# Patient Record
Sex: Male | Born: 1960 | Race: White | Hispanic: No | Smoking: Former smoker
Health system: Southern US, Community
[De-identification: ages and names within clinical notes are randomized; demographics above are authoritative.]

## PROBLEM LIST (undated history)

## (undated) DIAGNOSIS — F419 Anxiety disorder, unspecified: Secondary | ICD-10-CM

## (undated) DIAGNOSIS — K589 Irritable bowel syndrome without diarrhea: Secondary | ICD-10-CM

## (undated) DIAGNOSIS — F329 Major depressive disorder, single episode, unspecified: Secondary | ICD-10-CM

## (undated) DIAGNOSIS — G4730 Sleep apnea, unspecified: Secondary | ICD-10-CM

## (undated) DIAGNOSIS — D649 Anemia, unspecified: Secondary | ICD-10-CM

## (undated) DIAGNOSIS — M1990 Unspecified osteoarthritis, unspecified site: Secondary | ICD-10-CM

## (undated) DIAGNOSIS — J449 Chronic obstructive pulmonary disease, unspecified: Secondary | ICD-10-CM

## (undated) DIAGNOSIS — K219 Gastro-esophageal reflux disease without esophagitis: Secondary | ICD-10-CM

## (undated) DIAGNOSIS — T7840XA Allergy, unspecified, initial encounter: Secondary | ICD-10-CM

## (undated) DIAGNOSIS — G709 Myoneural disorder, unspecified: Secondary | ICD-10-CM

## (undated) DIAGNOSIS — I10 Essential (primary) hypertension: Secondary | ICD-10-CM

## (undated) DIAGNOSIS — F32A Depression, unspecified: Secondary | ICD-10-CM

## (undated) HISTORY — PX: COLONOSCOPY: SHX174

## (undated) HISTORY — DX: Myoneural disorder, unspecified: G70.9

## (undated) HISTORY — DX: Anemia, unspecified: D64.9

## (undated) HISTORY — DX: Allergy, unspecified, initial encounter: T78.40XA

## (undated) HISTORY — DX: Chronic obstructive pulmonary disease, unspecified: J44.9

## (undated) HISTORY — DX: Unspecified osteoarthritis, unspecified site: M19.90

---

## 1982-03-09 HISTORY — PX: HERNIA REPAIR: SHX51

## 1988-03-09 HISTORY — PX: BACK SURGERY: SHX140

## 1990-03-09 HISTORY — PX: KNEE SURGERY: SHX244

## 2013-05-10 ENCOUNTER — Ambulatory Visit: Payer: Self-pay | Admitting: Family Medicine

## 2014-10-22 ENCOUNTER — Encounter: Payer: Self-pay | Admitting: Family Medicine

## 2014-10-22 ENCOUNTER — Ambulatory Visit (INDEPENDENT_AMBULATORY_CARE_PROVIDER_SITE_OTHER): Payer: No Typology Code available for payment source | Admitting: Family Medicine

## 2014-10-22 VITALS — BP 140/80 | HR 80 | Temp 98.2°F | Resp 16 | Ht 74.0 in | Wt 291.8 lb

## 2014-10-22 DIAGNOSIS — R0789 Other chest pain: Secondary | ICD-10-CM

## 2014-10-22 DIAGNOSIS — S29011A Strain of muscle and tendon of front wall of thorax, initial encounter: Secondary | ICD-10-CM | POA: Diagnosis not present

## 2014-10-22 DIAGNOSIS — R7303 Prediabetes: Secondary | ICD-10-CM | POA: Insufficient documentation

## 2014-10-22 DIAGNOSIS — R0683 Snoring: Secondary | ICD-10-CM | POA: Insufficient documentation

## 2014-10-22 DIAGNOSIS — R351 Nocturia: Secondary | ICD-10-CM | POA: Insufficient documentation

## 2014-10-22 DIAGNOSIS — G4733 Obstructive sleep apnea (adult) (pediatric): Secondary | ICD-10-CM | POA: Insufficient documentation

## 2014-10-22 DIAGNOSIS — R1030 Lower abdominal pain, unspecified: Secondary | ICD-10-CM | POA: Insufficient documentation

## 2014-10-22 DIAGNOSIS — Z87438 Personal history of other diseases of male genital organs: Secondary | ICD-10-CM | POA: Insufficient documentation

## 2014-10-22 NOTE — Progress Notes (Signed)
Patient: Shaun Griffith Male    DOB: 1961-03-03   54 y.o.   MRN: [id number] Visit Date: 10/22/2014  Today's Provider: Vernie Murders, PA   Chief Complaint  Patient presents with  . Heartburn   Subjective:    Heartburn He complains of chest pain (in the middle of the chest), coughing and heartburn. He reports no hoarse voice, no nausea or no sore throat. This is a new problem. The current episode started 1 to 4 weeks ago. The problem occurs frequently. The problem has been unchanged (like a heartburn to aching). The heartburn duration is less than a minute. The heartburn is of mild (mild and moderate it depends ) intensity. The heartburn wakes (sometimes) him from sleep. The heartburn does not limit his activity. The heartburn changes with position. The symptoms are aggravated by stress. Associated symptoms include orthopnea (only when is hurting (patient also has sleep apnea)). Pertinent negatives include no anemia, fatigue, melena or muscle weakness. He has tried an antacid (malox, Scientist, product/process development) for the symptoms. The treatment provided mild relief.   History reviewed. No pertinent past medical history. Patient Active Problem List   Diagnosis Date Noted  . Borderline diabetes 10/22/2014  . Chest discomfort 10/22/2014  . Groin pain 10/22/2014  . Excessive urination at night 10/22/2014  . Obstructive apnea 10/22/2014  . Snores 10/22/2014  . 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 Knee surgery Left 1992  . Back surgery  1990    HNP L5  . Hernia repair  1984    right groin   Family History  Problem Relation Age of Onset  . Diabetes Mother   . Depression Mother   . Cancer Father     skin and blood     Allergies  Allergen Reactions  . Sulfadimethoxine Nausea Only    flu like symptoms   Previous Medications   IBUPROFEN (ADVIL) 200 MG TABLET    Take by mouth.   LEVOFLOXACIN (LEVAQUIN) 500 MG TABLET       NAPROXEN SODIUM (ANAPROX) 220 MG TABLET    Take 220 mg by mouth 2 (two) times daily with a meal.   TAMSULOSIN (FLOMAX) 0.4 MG CAPS CAPSULE        Review of Systems  Constitutional: Negative for appetite change and fatigue.  HENT: Positive for rhinorrhea (a little). Negative for hoarse voice and sore throat.   Eyes: Negative.   Respiratory: Positive for cough.   Cardiovascular: Positive for chest pain (in the middle of the chest).  Gastrointestinal: Positive for heartburn. Negative for nausea and melena.  Endocrine: Negative.   Genitourinary: Negative.   Musculoskeletal: Negative.  Negative for muscle weakness.  Skin: Negative.   Allergic/Immunologic: Positive for environmental allergies.       Seasonal  Neurological: Negative.   Hematological: Negative.   Psychiatric/Behavioral: Negative.     Social History  Substance Use Topics  . Smoking status: Current Every Day Smoker    Types: Cigarettes  . Smokeless tobacco: Never Used     Comment: Has been smoking for years 1-2 packs per week  . Alcohol Use: Yes     Comment: Moderate alcoholuse, drinks beer 3-4 days a week; drinks 8-10 beers   Objective:   BP 140/80 mmHg  Pulse 80  Temp(Src) 98.2 F (36.8 C) (Oral)  Resp 16  Ht 6\' 2"  (1.88 m)  Wt 291 lb 12.8 oz (132.36 kg)  BMI  37.45 kg/m2  Physical Exam  Constitutional: He is oriented to person, place, and time. He appears well-developed and well-nourished. No distress.  HENT:  Head: Normocephalic and atraumatic.  Right Ear: Hearing normal.  Left Ear: Hearing normal.  Nose: Nose normal.  Eyes: Conjunctivae and lids are normal. Right eye exhibits no discharge. Left eye exhibits no discharge. No scleral icterus.  Cardiovascular: Normal rate, regular rhythm and normal heart sounds.  Exam reveals no gallop and no friction rub.   No murmur heard. Pulmonary/Chest: Effort normal and breath sounds normal. No respiratory distress. He exhibits tenderness.  Tender spot in the left  pectoralis muscle at the sternal border. Sharp with testing left pectoralis strength.  Abdominal: Soft. Bowel sounds are normal.  Musculoskeletal: Normal range of motion.  Neurological: He is alert and oriented to person, place, and time.  Skin: Skin is intact. No lesion and no rash noted.  Psychiatric: He has a normal mood and affect. His speech is normal and behavior is normal. Thought content normal.      Assessment & Plan:     1. Strain of left pectoralis muscle, initial encounter Onset over the past 2 weeks. Remembers repetitively moving lumber prior to the chest wall pain developing. May use moist heat and use Aleve prn. Limit lifting and repetitive motion work. Recheck in 2 weeks if no better  2. Chest wall pain Tender spot in the left pectoralis muscle at the left mid sternal border. May use Zantac if any dyspepsia. Denies dyspnea, palpitations, nausea, vomiting, hematemesis or melena. Recheck prn.       Vernie Murders, PA  [facility]

## 2014-10-22 NOTE — Patient Instructions (Signed)

## 2014-10-23 ENCOUNTER — Encounter: Payer: Self-pay | Admitting: Family Medicine

## 2014-10-23 DIAGNOSIS — R0789 Other chest pain: Secondary | ICD-10-CM

## 2014-10-24 ENCOUNTER — Telehealth: Payer: Self-pay

## 2014-10-24 MED ORDER — METHOCARBAMOL 500 MG PO TABS: 500.0000 mg | ORAL_TABLET | Freq: Four times a day (QID) | ORAL | Status: DC

## 2014-10-24 NOTE — Telephone Encounter (Signed)
LMTCB 10/24/14    Note: Will send the prescription to [place] (this is the pharmacy in your chart for medications). Should recheck this chest wall strain in 2-3 days if no better. Apply moist heat if it helps and continue the Naproxen with this medication.  Thanks,  -Amera Banos

## 2014-10-24 NOTE — Telephone Encounter (Signed)
Will send the prescription to [place] (this is the pharmacy in your chart for medications). Should recheck this chest wall strain in 2-3 days if no better. Apply moist heat if it helps and continue the Naproxen with this medication.

## 2014-10-24 NOTE — Telephone Encounter (Signed)
Patient advised as directed below. Patient verbalized understanding and agrees with treatment plan. 

## 2015-02-01 ENCOUNTER — Encounter: Payer: Self-pay | Admitting: Family Medicine

## 2015-02-01 ENCOUNTER — Ambulatory Visit (INDEPENDENT_AMBULATORY_CARE_PROVIDER_SITE_OTHER): Payer: No Typology Code available for payment source | Admitting: Family Medicine

## 2015-02-01 VITALS — BP 144/84 | HR 85 | Temp 98.3°F | Resp 16 | Ht 74.0 in | Wt 285.0 lb

## 2015-02-01 DIAGNOSIS — IMO0001 Reserved for inherently not codable concepts without codable children: Secondary | ICD-10-CM

## 2015-02-01 DIAGNOSIS — R0789 Other chest pain: Secondary | ICD-10-CM

## 2015-02-01 DIAGNOSIS — R030 Elevated blood-pressure reading, without diagnosis of hypertension: Secondary | ICD-10-CM | POA: Diagnosis not present

## 2015-02-01 MED ORDER — METOPROLOL SUCCINATE ER 25 MG PO TB24: 25.0000 mg | ORAL_TABLET | Freq: Every day | ORAL | Status: DC

## 2015-02-01 NOTE — Patient Instructions (Signed)
Generalized Anxiety Disorder Generalized anxiety disorder (GAD) is a mental disorder. It interferes with life functions, including relationships, work, and school. GAD is different from normal anxiety, which everyone experiences at some point in their lives in response to specific life events and activities. Normal anxiety actually helps us prepare for and get through these life events and activities. Normal anxiety goes away after the event or activity is over.  GAD causes anxiety that is not necessarily related to specific events or activities. It also causes excess anxiety in proportion to specific events or activities. The anxiety associated with GAD is also difficult to control. GAD can vary from mild to severe. People with severe GAD can have intense waves of anxiety with physical symptoms (panic attacks).  SYMPTOMS The anxiety and worry associated with GAD are difficult to control. This anxiety and worry are related to many life events and activities and also occur more days than not for 6 months or longer. People with GAD also have three or more of the following symptoms (one or more in children):  Restlessness.   Fatigue.  Difficulty concentrating.   Irritability.  Muscle tension.  Difficulty sleeping or unsatisfying sleep. DIAGNOSIS GAD is diagnosed through an assessment by your health care provider. Your health care provider will ask you questions aboutyour mood,physical symptoms, and events in your life. Your health care provider may ask you about your medical history and use of alcohol or drugs, including prescription medicines. Your health care provider may also do a physical exam and blood tests. Certain medical conditions and the use of certain substances can cause symptoms similar to those associated with GAD. Your health care provider may refer you to a mental health specialist for further evaluation. TREATMENT The following therapies are usually used to treat GAD:    Medication. Antidepressant medication usually is prescribed for long-term daily control. Antianxiety medicines may be added in severe cases, especially when panic attacks occur.   Talk therapy (psychotherapy). Certain types of talk therapy can be helpful in treating GAD by providing support, education, and guidance. A form of talk therapy called cognitive behavioral therapy can teach you healthy ways to think about and react to daily life events and activities.  Stress managementtechniques. These include yoga, meditation, and exercise and can be very helpful when they are practiced regularly. A mental health specialist can help determine which treatment is best for you. Some people see improvement with one therapy. However, other people require a combination of therapies.   This information is not intended to replace advice given to you by your health care provider. Make sure you discuss any questions you have with your health care provider.   Document Released: 06/20/2012 Document Revised: 03/16/2014 Document Reviewed: 06/20/2012 Elsevier Interactive Patient Education 2016 [employer]  

## 2015-02-01 NOTE — Progress Notes (Signed)
Patient: Shaun Griffith Male    DOB: 1960/07/28   54 y.o.   MRN: [id number] Visit Date: 02/01/2015  Today's Provider: Vernie Murders, PA   Chief Complaint  Patient presents with  . Hypertension   Subjective:    HPI  Patient has had elevated blood pressure readings for the past month intermittently up to 168/108 five days ago .First episode was during a visit to the pharmacy for a flu shot. Has had episodes of chest tightness and palpitations. No nausea or dyspnea. Feels better if he is more physically active. No dizziness.   Patient Active Problem List   Diagnosis Date Noted  . Borderline diabetes 10/22/2014  . Chest discomfort 10/22/2014  . Groin pain 10/22/2014  . Excessive urination at night 10/22/2014  . Obstructive apnea 10/22/2014  . Snores 10/22/2014  . 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 Diabetes Mother   . Depression Mother   . Cancer Father     skin and blood   Past Surgical History  Procedure Laterality Date  . Knee surgery Left 1992  . Back surgery  1990    HNP L5  . Hernia repair  1984    right groin   Allergies  Allergen Reactions  . Sulfadimethoxine Nausea Only    flu like symptoms   Previous Medications   IBUPROFEN (ADVIL) 200 MG TABLET    Take by mouth.   METHOCARBAMOL (ROBAXIN) 500 MG TABLET    Take 1 tablet (500 mg total) by mouth 4 (four) times daily.   NAPROXEN SODIUM (ANAPROX) 220 MG TABLET    Take 220 mg by mouth 2 (two) times daily with a meal.   TAMSULOSIN (FLOMAX) 0.4 MG CAPS CAPSULE        Review of Systems  Respiratory: Positive for chest tightness. Negative for shortness of breath.   Cardiovascular: Positive for palpitations. Negative for chest pain.  Neurological: Negative for dizziness, light-headedness and headaches.    Social History  Substance Use Topics  . Smoking status: Current Every Day Smoker    Types: Cigarettes  . Smokeless tobacco:  Never Used     Comment: Has been smoking for years 1-2 packs per week  . Alcohol Use: Yes     Comment: Moderate alcoholuse, drinks beer 3-4 days a week; drinks 8-10 beers   Objective:   BP 144/84 mmHg  Pulse 85  Temp(Src) 98.3 F (36.8 C) (Oral)  Resp 16  Ht 6\' 2"  (1.88 m)  Wt 285 lb (129.275 kg)  BMI 36.58 kg/m2  SpO2 97%  Physical Exam  Constitutional: He is oriented to person, place, and time. He appears well-developed.  HENT:  Head: Normocephalic.  Eyes: Conjunctivae and EOM are normal.  Neck: Neck supple.  Cardiovascular: Normal rate, regular rhythm, normal heart sounds and intact distal pulses.   Pulmonary/Chest: Effort normal and breath sounds normal. He exhibits no tenderness.  Abdominal: Soft. Bowel sounds are normal.  Neurological: He is alert and oriented to person, place, and time.  Psychiatric: His behavior is normal. His mood appears anxious.      Assessment & Plan:     1. Chest pressure Intermittent pressure sensation over the past month. Usually occurs at rest and fades away with physical activity. Suspect anxiety induced and given handout on generalized anxiety. CPAP working well. EKG shows normal sinus rhythm with out acute changes.Will give low dose beta blocker. Recheck in  2 weeks.  - EKG 12-Lead  2. Elevated blood pressure Intermittent changes. Normal today. Suspect secondary to anxiety flares. Will treat with beta blocker and recheck in 2 weeks.    Vernie Murders, PA  [facility]

## 2015-02-08 ENCOUNTER — Telehealth: Payer: Self-pay | Admitting: Family Medicine

## 2015-02-08 NOTE — Telephone Encounter (Signed)
Describes sensation as a lightheaded feeling instead of true vertigo.BP down to 124/84 this evening. Scheduled appointment for Monday 02-11-15. Advised to come by the office tomorrow if any recurrences to have BP checked. May need to go to ER if symptoms recur and worsen. Patient agrees with plan.

## 2015-02-08 NOTE — Telephone Encounter (Signed)
Patient states that he saw Shaun Griffith on 11/25 an was already having some chest tightness. Per patient Shaun Griffith put him on Beta blocker has been having dizzy spells. Per patient the chest tightness is a little worsen than when he saw Shaun Griffith. Blood pressures this morning was 191/80's. What worries patient the most is the dizziness. Per patient no sharp pain and no arm pain, no shortness of breath.  Please Advise.  Thanks,  -Shaun Griffith

## 2015-02-08 NOTE — Telephone Encounter (Signed)
Pt stated he has been on a new medication which is making him feel dizzy and chest tightness.

## 2015-02-09 NOTE — Progress Notes (Signed)
Patient ID: Shaun Griffith, male   DOB: 02-Aug-1960, 54 y.o.   MRN: [id number]  Patient comes in today for a BP check. Patient reports that he felt a little dizzy yesterday. He reports that he has not experienced any dizziness this morning. However, patient has a slight headache. His BP today was 142/82. Patient reports that he has a F/U appt with Simona Huh on Monday (02/11/15) morning. Advised patient to keep his appt to discuss possible med changes. Patient verbalized understanding and agrees with treatment plan.

## 2015-02-11 ENCOUNTER — Ambulatory Visit (INDEPENDENT_AMBULATORY_CARE_PROVIDER_SITE_OTHER): Payer: No Typology Code available for payment source | Admitting: Family Medicine

## 2015-02-11 ENCOUNTER — Encounter: Payer: Self-pay | Admitting: Family Medicine

## 2015-02-11 VITALS — BP 142/88 | HR 94 | Temp 98.1°F | Resp 18 | Wt 287.0 lb

## 2015-02-11 DIAGNOSIS — F419 Anxiety disorder, unspecified: Secondary | ICD-10-CM | POA: Diagnosis not present

## 2015-02-11 MED ORDER — LORAZEPAM 0.5 MG PO TABS: 0.5000 mg | ORAL_TABLET | Freq: Two times a day (BID) | ORAL | Status: DC | PRN

## 2015-02-11 MED ORDER — SERTRALINE HCL 50 MG PO TABS: 50.0000 mg | ORAL_TABLET | Freq: Every day | ORAL | Status: DC

## 2015-02-11 NOTE — Telephone Encounter (Signed)
Patient in office today for a follow up.

## 2015-02-11 NOTE — Progress Notes (Signed)
Patient ID: Shaun Griffith, male   DOB: [birth year], 54 y.o.   MRN: [id number] Name: Shaun Griffith   MRN: [id number]    DOB: 1960-11-14   Date:02/11/2015       Progress Note  Subjective  Chief Complaint  Chief Complaint  Patient presents with  . Hypertension  . Dizziness    Hypertension This is a chronic problem. The current episode started more than 1 month ago. The problem is controlled. Associated symptoms include anxiety and chest pain. Pertinent negatives include no peripheral edema, shortness of breath or sweats. Risk factors for coronary artery disease include smoking/tobacco exposure. Past treatments include beta blockers.  Dizziness This is a new problem. The current episode started 1 to 4 weeks ago. Progression since onset: intermittent and associated with large crowds and chest tightness. Associated symptoms include chest pain. Associated symptoms comments: Some soreness. Associated with rest and disappears with activities. Sleeps 7-8 hours a night with CPAP. Eats 3 meals a day. Has 10-12 beers 3 times a week. Smoking less than a pack per day now on average. Denies caffeine intake..    Patient Active Problem List   Diagnosis Date Noted  . Borderline diabetes 10/22/2014  . Chest discomfort 10/22/2014  . Groin pain 10/22/2014  . Excessive urination at night 10/22/2014  . Obstructive apnea 10/22/2014  . Snores 10/22/2014  . 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 Knee surgery Left 1992  . Back surgery  1990    HNP L5  . Hernia repair  1984    right groin   Family History  Problem Relation Age of Onset  . Diabetes Mother   . Depression Mother   . Cancer Father     skin and blood    Social History  Substance Use Topics  . Smoking status: Current Every Day Smoker    Types: Cigarettes  . Smokeless tobacco: Never Used     Comment: Has been smoking for years 1-2 packs per week  . Alcohol Use: Yes      Comment: Moderate alcoholuse, drinks beer 3-4 days a week; drinks 8-10 beers    Current outpatient prescriptions:  .  ibuprofen (ADVIL) 200 MG tablet, Take by mouth., Disp: , Rfl:  .  metoprolol succinate (TOPROL-XL) 25 MG 24 hr tablet, Take 1 tablet (25 mg total) by mouth daily., Disp: 30 tablet, Rfl: 3 .  naproxen sodium (ANAPROX) 220 MG tablet, Take 220 mg by mouth 2 (two) times daily with a meal., Disp: , Rfl:  .  methocarbamol (ROBAXIN) 500 MG tablet, Take 1 tablet (500 mg total) by mouth 4 (four) times daily. (Patient not taking: Reported on 02/11/2015), Disp: 40 tablet, Rfl: 0 .  tamsulosin (FLOMAX) 0.4 MG CAPS capsule, , Disp: , Rfl:   Allergies  Allergen Reactions  . Sulfadimethoxine Nausea Only    flu like symptoms    Review of Systems  Constitutional: Negative.   HENT: Negative.   Eyes: Negative.   Respiratory: Negative.  Negative for shortness of breath.   Cardiovascular: Positive for chest pain.  Gastrointestinal: Negative.   Genitourinary: Negative.   Musculoskeletal: Negative.   Skin: Negative.   Neurological: Positive for dizziness.  Endo/Heme/Allergies: Negative.   Psychiatric/Behavioral: Negative.     Objective  Filed Vitals:   02/11/15 0929  BP: 142/88  Pulse: 94  Temp: 98.1 F (36.7 C)  TempSrc: Oral  Resp: 18  Weight: 287 lb (130.182 kg)  SpO2: 98%   BP Readings from Last 3 Encounters:  02/11/15 142/88  02/01/15 144/84  10/22/14 140/80     Physical Exam  Constitutional: He is well-developed, well-nourished, and in no distress.  HENT:  Head: Normocephalic.  Cardiovascular: Normal rate, regular rhythm and normal heart sounds.   Pulmonary/Chest: Effort normal and breath sounds normal. He exhibits no tenderness.  Abdominal: Soft. Bowel sounds are normal.  Psychiatric:  Very anxious in large crowds shopping recently. Caused flare of dizziness and chest tightness with BP elevations.    Assessment & Plan  1. Anxiety disorder, unspecified  anxiety disorder type Dizziness and chest tightness in large crowds with subsequent elevation of BP. Will continue Beta Blocker for BP and add Sertraline at bedtime. If acute attacks, may use the Lorazepam and recheck progress in a week - sertraline (ZOLOFT) 50 MG tablet; Take 1 tablet (50 mg total) by mouth daily.  Dispense: 30 tablet; Refill: 3 - LORazepam (ATIVAN) 0.5 MG tablet; Take 1 tablet (0.5 mg total) by mouth 2 (two) times daily as needed for anxiety.  Dispense: 30 tablet; Refill: 1

## 2015-02-18 ENCOUNTER — Encounter: Payer: Self-pay | Admitting: Family Medicine

## 2015-02-18 ENCOUNTER — Ambulatory Visit (INDEPENDENT_AMBULATORY_CARE_PROVIDER_SITE_OTHER): Payer: No Typology Code available for payment source | Admitting: Family Medicine

## 2015-02-18 VITALS — BP 138/82 | HR 87 | Temp 98.2°F | Resp 16 | Wt 282.4 lb

## 2015-02-18 DIAGNOSIS — R7303 Prediabetes: Secondary | ICD-10-CM | POA: Diagnosis not present

## 2015-02-18 DIAGNOSIS — R0789 Other chest pain: Secondary | ICD-10-CM | POA: Diagnosis not present

## 2015-02-18 DIAGNOSIS — F419 Anxiety disorder, unspecified: Secondary | ICD-10-CM | POA: Diagnosis not present

## 2015-02-18 NOTE — Progress Notes (Signed)
Patient ID: LOYDE RZEPECKI, male   DOB: [birth year], 54 y.o.   MRN: [id number]   Patient: Shaun Griffith Male    DOB: 12-12-1960   54 y.o.   MRN: [id number] Visit Date: 02/18/2015  Today's Provider: Vernie Murders, PA   Chief Complaint  Patient presents with  . Anxiety  . Follow-up   Subjective:    Anxiety Presents for follow-up visit. Symptoms include chest pain. Primary symptoms comment: slight lightheadness, and chest tightness. The severity of symptoms is mild.   Past treatments include SSRIs. The treatment provided moderate relief. Compliance with prior treatments has been good.   Patient Active Problem List   Diagnosis Date Noted  . Borderline diabetes 10/22/2014  . Chest discomfort 10/22/2014  . Groin pain 10/22/2014  . Excessive urination at night 10/22/2014  . Obstructive apnea 10/22/2014  . Snores 10/22/2014  . 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 Knee surgery Left 1992  . Back surgery  1990    HNP L5  . Hernia repair  1984    right groin   Allergies  Allergen Reactions  . Sulfadimethoxine Nausea Only    flu like symptoms     Previous Medications   IBUPROFEN (ADVIL) 200 MG TABLET    Take by mouth.   LORAZEPAM (ATIVAN) 0.5 MG TABLET    Take 1 tablet (0.5 mg total) by mouth 2 (two) times daily as needed for anxiety.   METHOCARBAMOL (ROBAXIN) 500 MG TABLET    Take 1 tablet (500 mg total) by mouth 4 (four) times daily.   METOPROLOL SUCCINATE (TOPROL-XL) 25 MG 24 HR TABLET    Take 1 tablet (25 mg total) by mouth daily.   NAPROXEN SODIUM (ANAPROX) 220 MG TABLET    Take 220 mg by mouth 2 (two) times daily with a meal.   SERTRALINE (ZOLOFT) 50 MG TABLET    Take 1 tablet (50 mg total) by mouth daily.   TAMSULOSIN (FLOMAX) 0.4 MG CAPS CAPSULE        Review of Systems  Constitutional: Negative.   HENT: Negative.   Eyes: Negative.   Respiratory: Negative.   Cardiovascular: Positive for chest  pain.  Gastrointestinal: Negative.   Endocrine: Negative.   Genitourinary: Negative.   Musculoskeletal: Negative.   Skin: Negative.   Allergic/Immunologic: Negative.   Neurological: Positive for light-headedness.  Hematological: Negative.   Psychiatric/Behavioral: Negative.     Social History  Substance Use Topics  . Smoking status: Current Every Day Smoker    Types: Cigarettes  . Smokeless tobacco: Never Used     Comment: Has been smoking for years 1-2 packs per week  . Alcohol Use: Yes     Comment: Moderate alcoholuse, drinks beer 3-4 days a week; drinks 8-10 beers   Objective:   BP 138/82 mmHg  Pulse 87  Temp(Src) 98.2 F (36.8 C) (Oral)  Resp 16  Wt 282 lb 6.4 oz (128.096 kg)  SpO2 97%  Physical Exam  Constitutional: He is oriented to person, place, and time. He appears well-developed and well-nourished.  HENT:  Head: Normocephalic.  Eyes: Conjunctivae are normal.  Neck: Normal range of motion. Neck supple. No thyromegaly present.  Cardiovascular: Normal rate, regular rhythm and normal heart sounds.   Pulmonary/Chest: Effort normal and breath sounds normal.  Abdominal: Soft. Bowel sounds are normal.  Neurological: He is alert and oriented to person, place, and time.  Psychiatric: He has a normal mood and  affect. His behavior is normal.  Improved anxiousness.      Assessment & Plan:     1. Chest discomfort Still having intermittent discomfort in mid chest tightness a little each day. Not always associated with exertion. No dyspnea or diaphoresis. Will refer to cardiologist for evaluation. Suspect due to anxiety disorder. - Lipid panel - Ambulatory referral to Cardiology  2. Borderline diabetes No episodes of hypoglycemia noted. Will recheck labs for progression - CBC with Differential/Platelet - Comprehensive metabolic panel - Hemoglobin A1c  3. Anxiety disorder, unspecified anxiety disorder type Improved sleep pattern and less stress in crowds since  starting the Sertraline and occasionally will use the Ativan if flared. Continue present regimen and recheck in 3 months. - TSH

## 2015-02-18 NOTE — Patient Instructions (Signed)
Agoraphobia Agoraphobia is a mental health disorder. It is a type of anxiety or fear. People with agoraphobia fear public places where they may be trapped, helpless, or embarrassed in the event of a panic attack or a loss of control. They often start to avoid the feared situations or insist that another person go with them. Agoraphobia may interfere with normal daily activities and personal relationships. People with severe agoraphobia may become completely homebound and dependent on others for grocery shopping and other errands. Agoraphobia usually begins before age 18, but it can start in the older adult years. People with agoraphobia are at risk for other anxiety disorders, depression, and substance abuse. CAUSES It is not known exactly what causes agoraphobia. RISK FACTORS Agoraphobia is more common in women. People who have panic disorder or have family members with agoraphobia are at higher risk of developing agoraphobia. SIGNS AND SYMPTOMS You may have agoraphobia if you have the following symptoms for 6 months or longer:  Intense fear about two or more of the following:  Using public transportation, such as cars, buses, planes, trains, or ships.  Being in open spaces, such as parking lots, shopping malls, or bridges.  Being in enclosed spaces, such as shops, theaters, or elevators.  Standing in line or being in a crowd.  Being outside the home alone.  Fear that is due to thoughts of being unable to escape or get help if certain events occur. The feared event may be a panic attack or panic-like symptoms, such as a racing heart, dizziness, and trouble breathing. In older people, the feared event may be a fall or loss of bowel control.  Reacting to feared situations by:  Avoiding them.  Requiring the presence of a companion.  Enduring them with intense fear or anxiety.  Fear or anxiety that is out of proportion to the actual danger that is posed by the event and the  situation. DIAGNOSIS Agoraphobia may be diagnosed by your health care provider. You will be asked questions about your fears and how they have affected you. You may be asked about your medical history and your use of medicines, alcohol, or drugs. Your health care provider may do a physical exam and order lab tests or other studies to rule out a medical condition. You may also be referred to a mental health specialist. TREATMENT Treatment usually includes a combination of counseling and medicines.  Counseling or talk therapy. Talk therapy is provided by mental health specialists. The following forms of talk therapy can be especially helpful:  Cognitive therapy. Cognitive therapy helps you to recognize and change unrealistic thoughts and beliefs that contribute to your fears.  Exposure therapy. Exposure therapy helps you to face and overcome your fears in a relaxed state and a safe environment.  Medicines. The following types of medicines may be helpful:  Antidepressants. Antidepressants are believed to affect certain chemicals in your brain. They can decrease general levels of anxiety and can help to prevent panic attacks.  Benzodiazepines. These medicines block feelings of anxiety and panic. They are very effective and act more quickly than antidepressants, but they are highly addictive. These medicines are recommended only for short-term use.  Beta blockers. Beta blockers can reduce physical symptoms of anxiety, such as a racing heart, sweating, and tremor. They may help you to feel less tense and anxious. HOME CARE INSTRUCTIONS  Keep all follow-up visits as directed by your health care provider. This is important.  Take all medicines only as directed by your  health care provider.  Try to exercise, eat a healthy diet, and get plenty of sleep.  Do not drink alcohol.  Do not use illegal drugs. SEEK MEDICAL CARE IF:  Your fear or anxiety gets worse.  You have new fears or  anxieties. SEEK IMMEDIATE MEDICAL CARE IF:  You have serious thoughts about hurting yourself or someone else.  You have trouble breathing or have chest pain. FOR MORE INFORMATION For more information, visit the website of the [employer] of [place] (ADAA): www.adaa.org   This information is not intended to replace advice given to you by your health care provider. Make sure you discuss any questions you have with your health care provider.   Document Released: 07/16/2010 Document Revised: 11/14/2014 Document Reviewed: 06/26/2013 Elsevier Interactive Patient Education [employer].

## 2015-02-20 LAB — COMPREHENSIVE METABOLIC PANEL
A/G RATIO: 1.5 (ref 1.1–2.5)
ALT: 27 IU/L (ref 0–44)
AST: 21 IU/L (ref 0–40)
Albumin: 4.1 g/dL (ref 3.5–5.5)
Alkaline Phosphatase: 55 IU/L (ref 39–117)
BILIRUBIN TOTAL: 0.6 mg/dL (ref 0.0–1.2)
BUN/Creatinine Ratio: 18 (ref 9–20)
BUN: 14 mg/dL (ref 6–24)
CO2: 22 mmol/L (ref 18–29)
CREATININE: 0.78 mg/dL (ref 0.76–1.27)
Calcium: 9.1 mg/dL (ref 8.7–10.2)
Chloride: 101 mmol/L (ref 96–106)
GFR, EST AFRICAN AMERICAN: 117 mL/min/{1.73_m2} (ref >59)
GFR, EST NON AFRICAN AMERICAN: 102 mL/min/{1.73_m2} (ref >59)
GLUCOSE: 84 mg/dL (ref 65–99)
Globulin, Total: 2.8 g/dL (ref 1.5–4.5)
Potassium: 4.3 mmol/L (ref 3.5–5.2)
SODIUM: 138 mmol/L (ref 134–144)
TOTAL PROTEIN: 6.9 g/dL (ref 6.0–8.5)

## 2015-02-20 LAB — CBC WITH DIFFERENTIAL/PLATELET
BASOS ABS: 0 10*3/uL (ref 0.0–0.2)
Basos: 1 %
EOS (ABSOLUTE): 0.3 10*3/uL (ref 0.0–0.4)
Eos: 4 %
Hematocrit: 41.9 % (ref 37.5–51.0)
Hemoglobin: 14.1 g/dL (ref 12.6–17.7)
IMMATURE GRANS (ABS): 0 10*3/uL (ref 0.0–0.1)
IMMATURE GRANULOCYTES: 0 %
LYMPHS: 29 %
Lymphocytes Absolute: 2 10*3/uL (ref 0.7–3.1)
MCH: 32.7 pg (ref 26.6–33.0)
MCHC: 33.7 g/dL (ref 31.5–35.7)
MCV: 97 fL (ref 79–97)
MONOS ABS: 0.6 10*3/uL (ref 0.1–0.9)
Monocytes: 8 %
NEUTROS PCT: 58 %
Neutrophils Absolute: 4 10*3/uL (ref 1.4–7.0)
PLATELETS: 163 10*3/uL (ref 150–379)
RBC: 4.31 x10E6/uL (ref 4.14–5.80)
RDW: 13 % (ref 12.3–15.4)
WBC: 6.9 10*3/uL (ref 3.4–10.8)

## 2015-02-20 LAB — LIPID PANEL
CHOLESTEROL TOTAL: 140 mg/dL (ref 100–199)
Chol/HDL Ratio: 2.9 ratio units (ref 0.0–5.0)
HDL: 49 mg/dL (ref >39)
LDL CALC: 69 mg/dL (ref 0–99)
Triglycerides: 108 mg/dL (ref 0–149)
VLDL Cholesterol Cal: 22 mg/dL (ref 5–40)

## 2015-02-20 LAB — HEMOGLOBIN A1C
ESTIMATED AVERAGE GLUCOSE: 108 mg/dL
HEMOGLOBIN A1C: 5.4 % (ref 4.8–5.6)

## 2015-02-20 LAB — TSH: TSH: 1.54 u[IU]/mL (ref 0.450–4.500)

## 2015-02-22 ENCOUNTER — Telehealth: Payer: Self-pay

## 2015-02-22 NOTE — Telephone Encounter (Signed)
-----   Message from [place], [place] sent at 02/21/2015  4:53 PM EST ----- All blood tests are normal. Continue present regimen and proceed with cardiology referral. No sign of diabetes now.

## 2015-02-22 NOTE — Telephone Encounter (Signed)
LMTCB

## 2015-02-22 NOTE — Telephone Encounter (Signed)
Patient advised as directed below. 

## 2015-02-25 DIAGNOSIS — R0789 Other chest pain: Secondary | ICD-10-CM | POA: Insufficient documentation

## 2015-04-01 ENCOUNTER — Ambulatory Visit (INDEPENDENT_AMBULATORY_CARE_PROVIDER_SITE_OTHER): Payer: BLUE CROSS/BLUE SHIELD | Admitting: Family Medicine

## 2015-04-01 ENCOUNTER — Encounter: Payer: Self-pay | Admitting: Family Medicine

## 2015-04-01 VITALS — BP 136/88 | HR 89 | Temp 98.0°F | Resp 16 | Wt 274.0 lb

## 2015-04-01 DIAGNOSIS — F419 Anxiety disorder, unspecified: Secondary | ICD-10-CM | POA: Diagnosis not present

## 2015-04-01 MED ORDER — LORAZEPAM 0.5 MG PO TABS: 0.5000 mg | ORAL_TABLET | Freq: Two times a day (BID) | ORAL | Status: DC | PRN

## 2015-04-01 NOTE — Progress Notes (Signed)
Patient ID: Shaun Griffith, male   DOB: Aug 02, 1960, 55 y.o.   MRN: [id number]   Patient: Shaun Griffith Male    DOB: 1960/12/06   55 y.o.   MRN: [id number] Visit Date: 04/01/2015  Today's Provider: Vernie Murders, PA   Chief Complaint  Patient presents with  . Anxiety  . Follow-up   Subjective:    Anxiety Presents for follow-up visit. The problem has been gradually improving. Symptoms include dizziness and nervous/anxious behavior. Primary symptoms comment: diarrhea.   Treatments tried: zoloft. The treatment provided moderate relief. Compliance with prior treatments has been good.   Patient Active Problem List   Diagnosis Date Noted  . Anxiety disorder 02/18/2015  . Borderline diabetes 10/22/2014  . Chest discomfort 10/22/2014  . Groin pain 10/22/2014  . Excessive urination at night 10/22/2014  . Obstructive apnea 10/22/2014  . Snores 10/22/2014  . 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 Knee surgery Left 1992  . Back surgery  1990    HNP L5  . Hernia repair  1984    right groin   Family History  Problem Relation Age of Onset  . Diabetes Mother   . Depression Mother   . Cancer Father     skin and blood   Allergies  Allergen Reactions  . Sulfadimethoxine Nausea Only    flu like symptoms     Previous Medications   IBUPROFEN (ADVIL) 200 MG TABLET    Take by mouth.   LORAZEPAM (ATIVAN) 0.5 MG TABLET    Take 1 tablet (0.5 mg total) by mouth 2 (two) times daily as needed for anxiety.   METOPROLOL SUCCINATE (TOPROL-XL) 25 MG 24 HR TABLET    Take 1 tablet (25 mg total) by mouth daily.   NAPROXEN SODIUM (ANAPROX) 220 MG TABLET    Take 220 mg by mouth 2 (two) times daily with a meal.   SERTRALINE (ZOLOFT) 50 MG TABLET    Take 1 tablet (50 mg total) by mouth daily.    Review of Systems  Constitutional: Negative.   HENT: Negative.   Eyes: Negative.   Respiratory: Negative.   Cardiovascular: Negative.    Gastrointestinal: Positive for diarrhea.  Endocrine: Negative.   Genitourinary: Negative.   Musculoskeletal: Negative.   Skin: Negative.   Allergic/Immunologic: Negative.   Neurological: Positive for dizziness.  Hematological: Negative.   Psychiatric/Behavioral: The patient is nervous/anxious.     Social History  Substance Use Topics  . Smoking status: Former Smoker    Types: Cigarettes  . Smokeless tobacco: Never Used     Comment: quit 2017  . Alcohol Use: 0.0 oz/week    0 Standard drinks or equivalent per week     Comment: Moderate alcohol use, drinks beer 3-4 days a week; drinks 8-10 beers   Objective:   BP 136/88 mmHg  Pulse 89  Temp(Src) 98 F (36.7 C) (Oral)  Resp 16  Wt 274 lb (124.286 kg)  SpO2 96%  Physical Exam  Constitutional: He is oriented to person, place, and time. He appears well-developed and well-nourished.  HENT:  Head: Normocephalic.  Right Ear: External ear normal.  Left Ear: External ear normal.  Mouth/Throat: Oropharynx is clear and moist.  Eyes: Conjunctivae and EOM are normal.  Neck: Normal range of motion. Neck supple.  Cardiovascular: Normal rate, regular rhythm and normal heart sounds.   Pulmonary/Chest: Effort normal and breath sounds normal.  Abdominal: Soft. Bowel sounds are normal.  Neurological: He is alert and oriented to person, place, and time.  Psychiatric: His behavior is normal. Thought content normal. His mood appears anxious.      Assessment & Plan:     1. Anxiety disorder, unspecified anxiety disorder type Still has episodes of anxiety and chest discomfort. Cardiology evaluation by stress test and echocardiogram did not find and arrhythmia or ischemic disease. Still feels the Sertraline 50 mg HS is helping with sleep and as a baseline treatment for the anxiety disorder. Still using Lorazepam 1 mg qd or prn. Some erectile dysfunction with use of Metoprolol, Sertraline and Lorazepam. Probable side effect. Doesn't want to use  any Viagra now. Will refill Ativan and recheck in 3 months. - LORazepam (ATIVAN) 0.5 MG tablet; Take 1 tablet (0.5 mg total) by mouth 2 (two) times daily as needed for anxiety.  Dispense: 30 tablet; Refill: 1

## 2015-05-30 ENCOUNTER — Encounter: Payer: Self-pay | Admitting: Family Medicine

## 2015-06-03 ENCOUNTER — Other Ambulatory Visit: Payer: Self-pay | Admitting: Family Medicine

## 2015-06-03 MED ORDER — SILDENAFIL CITRATE 50 MG PO TABS
ORAL_TABLET | ORAL | Status: DC
Start: 2015-06-03 — End: 2015-06-04

## 2015-06-04 ENCOUNTER — Ambulatory Visit (INDEPENDENT_AMBULATORY_CARE_PROVIDER_SITE_OTHER): Payer: BLUE CROSS/BLUE SHIELD | Admitting: Family Medicine

## 2015-06-04 ENCOUNTER — Encounter: Payer: Self-pay | Admitting: Family Medicine

## 2015-06-04 VITALS — BP 142/90 | HR 65 | Temp 98.6°F | Resp 14 | Wt 266.0 lb

## 2015-06-04 DIAGNOSIS — N529 Male erectile dysfunction, unspecified: Secondary | ICD-10-CM

## 2015-06-04 DIAGNOSIS — F419 Anxiety disorder, unspecified: Secondary | ICD-10-CM | POA: Diagnosis not present

## 2015-06-04 DIAGNOSIS — IMO0001 Reserved for inherently not codable concepts without codable children: Secondary | ICD-10-CM

## 2015-06-04 DIAGNOSIS — Z8679 Personal history of other diseases of the circulatory system: Secondary | ICD-10-CM | POA: Diagnosis not present

## 2015-06-04 DIAGNOSIS — R0789 Other chest pain: Secondary | ICD-10-CM | POA: Diagnosis not present

## 2015-06-04 DIAGNOSIS — R030 Elevated blood-pressure reading, without diagnosis of hypertension: Secondary | ICD-10-CM

## 2015-06-04 DIAGNOSIS — Z87898 Personal history of other specified conditions: Secondary | ICD-10-CM

## 2015-06-04 MED ORDER — SERTRALINE HCL 50 MG PO TABS: 50.0000 mg | ORAL_TABLET | Freq: Every day | ORAL | Status: DC

## 2015-06-04 MED ORDER — LORAZEPAM 0.5 MG PO TABS: 0.5000 mg | ORAL_TABLET | Freq: Two times a day (BID) | ORAL | Status: DC | PRN

## 2015-06-04 MED ORDER — METOPROLOL SUCCINATE ER 25 MG PO TB24: 25.0000 mg | ORAL_TABLET | Freq: Every day | ORAL | Status: DC

## 2015-06-04 MED ORDER — SILDENAFIL CITRATE 20 MG PO TABS: ORAL_TABLET | ORAL | Status: DC

## 2015-06-04 NOTE — Progress Notes (Signed)
Patient ID: Shaun Griffith, male   DOB: 05-Dec-1960, 55 y.o.   MRN: [id number]   Patient: Shaun Griffith Male    DOB: 02-03-1961   55 y.o.   MRN: [id number] Visit Date: 06/04/2015  Today's Provider: Vernie Murders, PA   Chief Complaint  Patient presents with  . Prostate Check   Subjective:    Anxiety Presents for follow-up visit. Onset was 6 to 12 months ago. Symptoms include impotence, nervous/anxious behavior and palpitations. Patient reports no depressed mood, dizziness, hyperventilation or panic. Primary symptoms comment: rare palpitations controlled by Metoprolol. Lorazepam prn and daily Zoloft at bedtime controlling symptoms well.. Symptoms occur occasionally. The patient sleeps 7 hours per night. The quality of sleep is good. Nighttime awakenings: none.   Past treatments include benzodiazephines and SSRIs.   Patient is requesting testosterone and prostate level be checked due to decreased libido. Onset over the past 3-4 months with difficulty attaining and maintaining erections for intercourse. Denies spontaneous erectiles. Able to stimulate full erection but won't remain to accomplish intercourse.  Patient Active Problem List   Diagnosis Date Noted  . Anxiety disorder 02/18/2015  . Borderline diabetes 10/22/2014  . Chest discomfort 10/22/2014  . Groin pain 10/22/2014  . Excessive urination at night 10/22/2014  . Obstructive apnea 10/22/2014  . Snores 10/22/2014  . 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 Knee surgery Left 1992  . Back surgery  1990    HNP L5  . Hernia repair  1984    right groin   Family History  Problem Relation Age of Onset  . Diabetes Mother   . Depression Mother   . Cancer Father     skin and blood   Previous Medications   IBUPROFEN (ADVIL) 200 MG TABLET    Take by mouth.   LORAZEPAM (ATIVAN) 0.5 MG TABLET    Take 1 tablet (0.5 mg total) by mouth 2 (two) times daily as needed  for anxiety.   METOPROLOL SUCCINATE (TOPROL-XL) 25 MG 24 HR TABLET    Take 1 tablet (25 mg total) by mouth daily.   NAPROXEN SODIUM (ANAPROX) 220 MG TABLET    Take 220 mg by mouth 2 (two) times daily with a meal.   SERTRALINE (ZOLOFT) 50 MG TABLET    Take 1 tablet (50 mg total) by mouth daily.   SILDENAFIL (VIAGRA) 50 MG TABLET    Take one tablet by mouth 1-4 hours prior to intercourse. Limit one tablet in any 24 hours.   Allergies  Allergen Reactions  . Sulfadimethoxine Nausea Only    flu like symptoms    Review of Systems  Constitutional: Negative.   HENT: Negative.   Eyes: Negative.   Respiratory: Negative.   Cardiovascular: Positive for palpitations.  Gastrointestinal: Negative.   Endocrine: Negative.   Genitourinary: Positive for impotence.       Decreased libido   Musculoskeletal: Negative.   Skin: Negative.   Allergic/Immunologic: Negative.   Neurological: Negative.  Negative for dizziness.  Hematological: Negative.   Psychiatric/Behavioral: The patient is nervous/anxious.    Social History  Substance Use Topics  . Smoking status: Former Smoker    Types: Cigarettes  . Smokeless tobacco: Never Used     Comment: quit 2017  . Alcohol Use: 0.0 oz/week    0 Standard drinks or equivalent per week     Comment: Moderate alcohol use, drinks beer 3-4 days a week; drinks 8-10 beers  Objective:   BP 142/90 mmHg  Pulse 65  Temp(Src) 98.6 F (37 C) (Oral)  Resp 14  Wt 266 lb (120.657 kg)  Physical Exam  Constitutional: He is oriented to person, place, and time. He appears well-developed and well-nourished.  HENT:  Head: Normocephalic and atraumatic.  Eyes: Conjunctivae and EOM are normal.  Neck: Neck supple.  Cardiovascular: Normal rate, regular rhythm and normal heart sounds.   No murmur heard. Pulmonary/Chest: Effort normal and breath sounds normal.  Abdominal: Soft. Bowel sounds are normal.  Genitourinary: Prostate normal. Guaiac negative stool.    Musculoskeletal: Normal range of motion.  Neurological: He is alert and oriented to person, place, and time.  Skin: Skin is warm and dry.  Psychiatric: His behavior is normal. Thought content normal. His mood appears anxious.      Assessment & Plan:     1. Erectile dysfunction, unspecified erectile dysfunction type Onset over the past 3-4 months. Described as inability to maintain erections to accomplish intercourse. Will check PSA, Testosterone and give sildenafil. Recheck pending reports. - PSA - Testosterone - sildenafil (REVATIO) 20 MG tablet; 2-4 tablets by mouth 1-4 hours prior to intercourse - limit dosing to once in 24 hours.  Dispense: 30 tablet; Refill: 0  2. Anxiety disorder, unspecified anxiety disorder type Better control with Sertraline at bedtime and intermittent use of Ativan prn acute anxiety/panic sensations. Will check labs for metabolic disorder and continue present medication. Recheck pending reports. - Comprehensive metabolic panel - LORazepam (ATIVAN) 0.5 MG tablet; Take 1 tablet (0.5 mg total) by mouth 2 (two) times daily as needed for anxiety.  Dispense: 30 tablet; Refill: 1 - sertraline (ZOLOFT) 50 MG tablet; Take 1 tablet (50 mg total) by mouth daily.  Dispense: 30 tablet; Refill: 3  3. History of palpitations Rare occurrence at rest with anxiety. Will check for signs of anemia or metabolic disorder. Will continue Toprol-XL. - CBC with Differential/Platelet  4. Chest pressure Negative evaluation by cardiologist for signs of ischemic disease. Will continue Toprol-XL and anxiety medications. - metoprolol succinate (TOPROL-XL) 25 MG 24 hr tablet; Take 1 tablet (25 mg total) by mouth daily.  Dispense: 30 tablet; Refill: 3  5. Elevated blood pressure BP Readings from Last 3 Encounters:  06/04/15 142/90  04/01/15 136/88  02/18/15 138/82   Slight elevation of BP. Will refill and restart Toprol-XL. Should take this daily for control. Recheck BP in 1 month. -  metoprolol succinate (TOPROL-XL) 25 MG 24 hr tablet; Take 1 tablet (25 mg total) by mouth daily.  Dispense: 30 tablet; Refill: 3

## 2015-06-04 NOTE — Patient Instructions (Signed)
Erectile Dysfunction  Erectile dysfunction is the inability to get or sustain a good enough erection to have sexual intercourse. Erectile dysfunction may involve:   Inability to get an erection.   Lack of enough hardness to allow penetration.   Loss of the erection before sex is finished.   Premature ejaculation.  CAUSES   Certain drugs, such as:    Pain relievers.    Antihistamines.    Antidepressants.    Blood pressure medicines.    Water pills (diuretics).    Ulcer medicines.    Muscle relaxants.    Illegal drugs.   Excessive drinking.   Psychological causes, such as:    Anxiety.    Depression.    Sadness.    Exhaustion.    Performance fear.    Stress.   Physical causes, such as:    Artery problems. This may include diabetes, smoking, liver disease, or atherosclerosis.    High blood pressure.    Hormonal problems, such as low testosterone.    Obesity.    Nerve problems. This may include back or pelvic injuries, diabetes mellitus, multiple sclerosis, or Parkinson disease.  SYMPTOMS   Inability to get an erection.   Lack of enough hardness to allow penetration.   Loss of the erection before sex is finished.   Premature ejaculation.   Normal erections at some times, but with frequent unsatisfactory episodes.   Orgasms that are not satisfactory in sensation or frequency.   Low sexual satisfaction in either partner because of erection problems.   A curved penis occurring with erection. The curve may cause pain or may be too curved to allow for intercourse.   Never having nighttime erections.  DIAGNOSIS  Your caregiver can often diagnose this condition by:   Performing a physical exam to find other diseases or specific problems with the penis.   Asking you detailed questions about the problem.   Performing blood tests to check for diabetes mellitus or to measure hormone levels.   Performing urine tests to find other underlying health conditions.   Performing an ultrasound exam to check for  scarring.   Performing a test to check blood flow to the penis.   Doing a sleep study at home to measure nighttime erections.  TREATMENT    You may be prescribed medicines by mouth.   You may be given medicine injections into the penis.   You may be prescribed a vacuum pump with a ring.   Penile implant surgery may be performed. You may receive:    An inflatable implant.    A semirigid implant.   Blood vessel surgery may be performed.  HOME CARE INSTRUCTIONS   If you are prescribed oral medicine, you should take the medicine as prescribed. Do not increase the dosage without first discussing it with your physician.   If you are using self-injections, be careful to avoid any veins that are on the surface of the penis. Apply pressure to the injection site for 5 minutes.   If you are using a vacuum pump, make sure you have read the instructions before using it. Discuss any questions with your physician before taking the pump home.  SEEK MEDICAL CARE IF:   You experience pain that is not responsive to the pain medicine you have been prescribed.   You experience nausea or vomiting.  SEEK IMMEDIATE MEDICAL CARE IF:    When taking oral or injectable medications, you experience an erection that lasts longer than 4 hours. If your   physician is unavailable, go to the nearest emergency room for evaluation. An erection that lasts much longer than 4 hours can result in permanent damage to your penis.   You have pain that is severe.   You develop redness, severe pain, or severe swelling of your penis.   You have redness spreading up into your groin or lower abdomen.   You are unable to pass your urine.     This information is not intended to replace advice given to you by your health care provider. Make sure you discuss any questions you have with your health care provider.     Document Released: 02/21/2000 Document Revised: 10/26/2012 Document Reviewed: 07/28/2012  Elsevier Interactive Patient Education 2016  Elsevier [employer]

## 2015-06-05 LAB — CBC WITH DIFFERENTIAL/PLATELET
BASOS: 1 %
Basophils Absolute: 0.1 10*3/uL (ref 0.0–0.2)
EOS (ABSOLUTE): 0.2 10*3/uL (ref 0.0–0.4)
EOS: 3 %
HEMATOCRIT: 41.9 % (ref 37.5–51.0)
Hemoglobin: 14.3 g/dL (ref 12.6–17.7)
IMMATURE GRANULOCYTES: 0 %
Immature Grans (Abs): 0 10*3/uL (ref 0.0–0.1)
LYMPHS ABS: 1.6 10*3/uL (ref 0.7–3.1)
Lymphs: 20 %
MCH: 31.5 pg (ref 26.6–33.0)
MCHC: 34.1 g/dL (ref 31.5–35.7)
MCV: 92 fL (ref 79–97)
MONOS ABS: 0.5 10*3/uL (ref 0.1–0.9)
Monocytes: 6 %
Neutrophils Absolute: 5.7 10*3/uL (ref 1.4–7.0)
Neutrophils: 70 %
Platelets: 172 10*3/uL (ref 150–379)
RBC: 4.54 x10E6/uL (ref 4.14–5.80)
RDW: 13.2 % (ref 12.3–15.4)
WBC: 8.1 10*3/uL (ref 3.4–10.8)

## 2015-06-05 LAB — COMPREHENSIVE METABOLIC PANEL
A/G RATIO: 1.8 (ref 1.2–2.2)
ALBUMIN: 4.5 g/dL (ref 3.5–5.5)
ALT: 20 IU/L (ref 0–44)
AST: 19 IU/L (ref 0–40)
Alkaline Phosphatase: 68 IU/L (ref 39–117)
BUN / CREAT RATIO: 16 (ref 9–20)
BUN: 11 mg/dL (ref 6–24)
Bilirubin Total: 0.4 mg/dL (ref 0.0–1.2)
CALCIUM: 9.6 mg/dL (ref 8.7–10.2)
CO2: 23 mmol/L (ref 18–29)
CREATININE: 0.69 mg/dL — AB (ref 0.76–1.27)
Chloride: 104 mmol/L (ref 96–106)
GFR, EST AFRICAN AMERICAN: 124 mL/min/{1.73_m2} (ref >59)
GFR, EST NON AFRICAN AMERICAN: 107 mL/min/{1.73_m2} (ref >59)
GLOBULIN, TOTAL: 2.5 g/dL (ref 1.5–4.5)
Glucose: 89 mg/dL (ref 65–99)
POTASSIUM: 4.4 mmol/L (ref 3.5–5.2)
SODIUM: 142 mmol/L (ref 134–144)
Total Protein: 7 g/dL (ref 6.0–8.5)

## 2015-06-05 LAB — TESTOSTERONE: Testosterone: 501 ng/dL (ref 348–1197)

## 2015-06-05 LAB — PSA: Prostate Specific Ag, Serum: 0.6 ng/mL (ref 0.0–4.0)

## 2015-07-01 ENCOUNTER — Ambulatory Visit (INDEPENDENT_AMBULATORY_CARE_PROVIDER_SITE_OTHER): Payer: BLUE CROSS/BLUE SHIELD | Admitting: Family Medicine

## 2015-07-01 ENCOUNTER — Encounter: Payer: Self-pay | Admitting: Family Medicine

## 2015-07-01 VITALS — BP 128/88 | HR 75 | Temp 97.6°F | Resp 14 | Wt 265.2 lb

## 2015-07-01 DIAGNOSIS — F419 Anxiety disorder, unspecified: Secondary | ICD-10-CM

## 2015-07-01 DIAGNOSIS — N529 Male erectile dysfunction, unspecified: Secondary | ICD-10-CM

## 2015-07-01 DIAGNOSIS — I10 Essential (primary) hypertension: Secondary | ICD-10-CM

## 2015-07-01 NOTE — Progress Notes (Signed)
Patient ID: Shaun Griffith, male   DOB: 04-18-1960, 55 y.o.   MRN: [id number]   Patient: Shaun Griffith Male    DOB: 02-Jan-1961   55 y.o.   MRN: [id number] Visit Date: 07/01/2015  Today's Provider: Vernie Murders, PA   Chief Complaint  Patient presents with  . Erectile Dysfunction  . Follow-up   Subjective:    Erectile Dysfunction The problem has been gradually improving since onset. Non-physiologic factors contributing to erectile dysfunction are anxiety. Past treatments include sildenafil.  Hypertension Associated symptoms include anxiety. Pertinent negatives include no shortness of breath. (Intermittent anxiety occasionally with social events.) Risk factors for coronary artery disease include male gender and dyslipidemia. Past treatments include beta blockers.   BP Readings from Last 3 Encounters:  07/01/15 128/88  06/04/15 142/90  04/01/15 136/88   Patient Active Problem List   Diagnosis Date Noted  . Hypertension 07/01/2015  . Anxiety disorder 02/18/2015  . Borderline diabetes 10/22/2014  . Chest discomfort 10/22/2014  . Groin pain 10/22/2014  . Excessive urination at night 10/22/2014  . Obstructive apnea 10/22/2014  . Snores 10/22/2014  . 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 Knee surgery Left 1992  . Back surgery  1990    HNP L5  . Hernia repair  1984    right groin   Family History  Problem Relation Age of Onset  . Diabetes Mother   . Depression Mother   . Cancer Father     skin and blood     Previous Medications   IBUPROFEN (ADVIL) 200 MG TABLET    Take by mouth.   LORAZEPAM (ATIVAN) 0.5 MG TABLET    Take 1 tablet (0.5 mg total) by mouth 2 (two) times daily as needed for anxiety.   METOPROLOL SUCCINATE (TOPROL-XL) 25 MG 24 HR TABLET    Take 1 tablet (25 mg total) by mouth daily.   NAPROXEN SODIUM (ANAPROX) 220 MG TABLET    Take 220 mg by mouth 2 (two) times daily with a meal.   SERTRALINE (ZOLOFT) 50 MG TABLET    Take 1 tablet (50 mg total) by mouth daily.   SILDENAFIL (REVATIO) 20 MG TABLET    2-4 tablets by mouth 1-4 hours prior to intercourse - limit dosing to once in 24 hours.   Allergies  Allergen Reactions  . Sulfadimethoxine Nausea Only    flu like symptoms    Review of Systems  Constitutional: Negative.   HENT: Negative.   Eyes: Negative.   Respiratory: Negative.  Negative for shortness of breath.   Cardiovascular: Negative.   Gastrointestinal: Negative.        Occasional loose stools helped by probiotic supplements.  Psychiatric/Behavioral: The patient is nervous/anxious.     Social History  Substance Use Topics  . Smoking status: Former Smoker    Types: Cigarettes  . Smokeless tobacco: Never Used     Comment: quit 2017  . Alcohol Use: 0.0 oz/week    0 Standard drinks or equivalent per week     Comment: Moderate alcohol use, drinks beer 3-4 days a week; drinks 8-10 beers   Objective:   BP 128/88 mmHg  Pulse 75  Temp(Src) 97.6 F (36.4 C) (Oral)  Resp 14  Wt 265 lb 3.2 oz (120.294 kg)  Physical Exam  Constitutional: He is oriented to person, place, and time. He appears well-developed and well-nourished. No distress.  HENT:  Head: Normocephalic and atraumatic.  Right Ear: Hearing normal.  Left Ear: Hearing normal.  Nose: Nose normal.  Eyes: Conjunctivae and lids are normal. Right eye exhibits no discharge. Left eye exhibits no discharge. No scleral icterus.  Neck: Neck supple.  Cardiovascular: Normal rate and regular rhythm.   Pulmonary/Chest: Effort normal and breath sounds normal. No respiratory distress.  Abdominal: Soft. Bowel sounds are normal.  Musculoskeletal: Normal range of motion.  Neurological: He is alert and oriented to person, place, and time.  Skin: Skin is intact. No lesion and no rash noted.  Psychiatric: He has a normal mood and affect. His speech is normal and behavior is normal. Thought content normal.        Assessment & Plan:     1. Erectile dysfunction, unspecified erectile dysfunction type Tolerating Sildenafil 20 mg 2-4 tablets prior to intercourse. Seems to help with attaining and maintaining erections during intercourse. No side effects with vision, uncontrolled or persistent erections, chest pains or palpitations. Will continue present dosage. Recheck prn.  2. Essential hypertension Well controlled BP and tolerating Metoprolol succinate 25 mg qd. Seems to help with anxiety symptoms, also. No tachycardia or tremor. Recheck in 3 months.  3. Anxiety disorder, unspecified anxiety disorder type Stable fairly well controlled using Sertraline at bedtime and Ativan prn acute anxiety/[amoc attacks. Recheck prn.

## 2015-08-06 ENCOUNTER — Telehealth: Payer: Self-pay | Admitting: Family Medicine

## 2015-08-06 NOTE — Telephone Encounter (Signed)
Please review-aa 

## 2015-08-06 NOTE — Telephone Encounter (Signed)
Pt needs refill on LORazepam (ATIVAN) 0.5 MG tablet / [facility]  He uses [facility]  Pt call back is [phone]  Thanks Con Memos

## 2015-08-07 ENCOUNTER — Other Ambulatory Visit: Payer: Self-pay

## 2015-08-07 DIAGNOSIS — F419 Anxiety disorder, unspecified: Secondary | ICD-10-CM

## 2015-08-07 MED ORDER — LORAZEPAM 0.5 MG PO TABS: 0.5000 mg | ORAL_TABLET | Freq: Two times a day (BID) | ORAL | Status: DC | PRN

## 2015-08-07 NOTE — Telephone Encounter (Signed)
#  [id number] rf

## 2015-08-22 ENCOUNTER — Ambulatory Visit (INDEPENDENT_AMBULATORY_CARE_PROVIDER_SITE_OTHER): Payer: BLUE CROSS/BLUE SHIELD | Admitting: Family Medicine

## 2015-08-22 ENCOUNTER — Encounter: Payer: Self-pay | Admitting: Family Medicine

## 2015-08-22 VITALS — BP 124/78 | HR 88 | Temp 98.3°F | Resp 14 | Ht 75.5 in | Wt 257.0 lb

## 2015-08-22 DIAGNOSIS — R0981 Nasal congestion: Secondary | ICD-10-CM | POA: Diagnosis not present

## 2015-08-22 DIAGNOSIS — F419 Anxiety disorder, unspecified: Secondary | ICD-10-CM | POA: Diagnosis not present

## 2015-08-22 DIAGNOSIS — R42 Dizziness and giddiness: Secondary | ICD-10-CM

## 2015-08-22 MED ORDER — PAROXETINE HCL 10 MG PO TABS: 10.0000 mg | ORAL_TABLET | Freq: Every day | ORAL | Status: DC

## 2015-08-22 NOTE — Progress Notes (Signed)
Patient ID: Shaun Griffith, male   DOB: [birth year], 55 y.o.   MRN: [id number]       Patient: Shaun Griffith Male    DOB: [birth year]   55 y.o.   MRN: [id number] Visit Date: 08/22/2015  Today's Provider: Vernie Murders, PA   Chief Complaint  Patient presents with  . Sinusitis    X 2 weeks.    Subjective:    Sinusitis This is a new problem. The current episode started 1 to 4 weeks ago. The problem has been gradually worsening since onset. There has been no fever. Associated symptoms include congestion, coughing, ear pain, headaches and sinus pressure. Pertinent negatives include no chills or diaphoresis. Past treatments include nothing.   Patient reports that he has also had dizziness off and on for the last 2 weeks. Patient is unsure if it could be his sinuses or one of his medications. Patient reports that he has been on Zoloft since the winter (approx 6 months), but thinks that this could be contributing to his symptoms.    No past medical history on file. Patient Active Problem List   Diagnosis Date Noted  . Hypertension 07/01/2015  . Anxiety disorder 02/18/2015  . Borderline diabetes 10/22/2014  . Chest discomfort 10/22/2014  . Groin pain 10/22/2014  . Excessive urination at night 10/22/2014  . Obstructive apnea 10/22/2014  . Snores 10/22/2014  . 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 Knee surgery Left 1992  . Back surgery  1990    HNP L5  . Hernia repair  1984    right groin   Family History  Problem Relation Age of Onset  . Diabetes Mother   . Depression Mother   . Cancer Father     skin and blood    Allergies  Allergen Reactions  . Sulfadimethoxine Nausea Only    flu like symptoms   Current Meds  Medication Sig  . ibuprofen (ADVIL) 200 MG tablet Take by mouth.  Marland Kitchen LORazepam (ATIVAN) 0.5 MG tablet Take 1 tablet (0.5 mg total) by mouth 2 (two) times daily as needed for anxiety.  .  metoprolol succinate (TOPROL-XL) 25 MG 24 hr tablet Take 1 tablet (25 mg total) by mouth daily.  . naproxen sodium (ANAPROX) 220 MG tablet Take 220 mg by mouth 2 (two) times daily with a meal.  . sertraline (ZOLOFT) 50 MG tablet Take 1 tablet (50 mg total) by mouth daily.    Review of Systems  Constitutional: Positive for activity change and fatigue. Negative for fever, chills, diaphoresis, appetite change and unexpected weight change.  HENT: Positive for congestion, ear pain, postnasal drip and sinus pressure.   Respiratory: Positive for cough.   Cardiovascular: Negative.   Neurological: Positive for headaches.    Social History  Substance Use Topics  . Smoking status: Former Smoker    Types: Cigarettes  . Smokeless tobacco: Never Used     Comment: quit 2017  . Alcohol Use: 0.0 oz/week    0 Standard drinks or equivalent per week     Comment: Moderate alcohol use, drinks beer 3-4 days a week; drinks 8-10 beers   Objective:   BP 124/78 mmHg  Pulse 88  Temp(Src) 98.3 F (36.8 C)  Resp 14  Ht 6' 3.5" (1.918 m)  Wt 257 lb (116.574 kg)  BMI 31.69 kg/m2  Physical Exam  Constitutional: He is oriented to person, place, and time. He appears well-developed and  well-nourished.  HENT:  Head: Normocephalic.  Right Ear: External ear normal.  Left Ear: External ear normal.  Nose: Nose normal.  Mouth/Throat: Oropharynx is clear and moist.  Eyes: EOM are normal.  Neck: Neck supple.  Cardiovascular: Normal rate and normal heart sounds.   Pulmonary/Chest: Effort normal and breath sounds normal.  Abdominal: Soft. Bowel sounds are normal.  Musculoskeletal: Normal range of motion.  Lymphadenopathy:    He has no cervical adenopathy.  Neurological: He is alert and oriented to person, place, and time.  Slight orthostatic dizziness after bending over. Lasts 4-5 seconds.  Psychiatric: His speech is normal and behavior is normal. Judgment and thought content normal. His mood appears anxious.  Cognition and memory are normal. He exhibits a depressed mood.      Assessment & Plan:     1. Dizziness and giddiness Intermittent over the past couple weeks. No vomiting. Seems to occur with orthostatic changes. Encouraged to drink extra fluids and may use antihistamine prn. Recheck pending lab reports. - CBC with Differential/Platelet - Comprehensive metabolic panel  2. Sinus congestion No fever. Some stuffiness with ears feeling full. No sign of infection. Will check CBC. May use Flonase nasal spray and OTC antihistamine for suspected allergy component.  - CBC with Differential/Platelet  3. Anxiety disorder, unspecified anxiety disorder type Only occasional use of Ativan. Concerned the Zoloft is causing some diarrhea. No nausea or vomiting. Diarrhea does not happen every day and some weeks has "constipation". Suspect spastic colon syndrome. Has used Paxil in the past for anxiety with depressive reaction. Will discontinue Zoloft and allow him to go back to the Paxil in 2 day. Keep appointment scheduled in July for follow up of response. - PARoxetine (PAXIL) 10 MG tablet; Take 1 tablet (10 mg total) by mouth daily.  Dispense: 30 tablet; Refill: [facility]

## 2015-08-23 LAB — COMPREHENSIVE METABOLIC PANEL
A/G RATIO: 1.3 (ref 1.2–2.2)
ALT: 16 IU/L (ref 0–44)
AST: 18 IU/L (ref 0–40)
Albumin: 4.3 g/dL (ref 3.5–5.5)
Alkaline Phosphatase: 71 IU/L (ref 39–117)
BILIRUBIN TOTAL: 0.5 mg/dL (ref 0.0–1.2)
BUN / CREAT RATIO: 16 (ref 9–20)
BUN: 13 mg/dL (ref 6–24)
CALCIUM: 9.9 mg/dL (ref 8.7–10.2)
CO2: 24 mmol/L (ref 18–29)
CREATININE: 0.81 mg/dL (ref 0.76–1.27)
Chloride: 101 mmol/L (ref 96–106)
GFR calc Af Amer: 116 mL/min/{1.73_m2} (ref >59)
GFR, EST NON AFRICAN AMERICAN: 100 mL/min/{1.73_m2} (ref >59)
GLOBULIN, TOTAL: 3.3 g/dL (ref 1.5–4.5)
Glucose: 87 mg/dL (ref 65–99)
Potassium: 4.4 mmol/L (ref 3.5–5.2)
Sodium: 141 mmol/L (ref 134–144)
Total Protein: 7.6 g/dL (ref 6.0–8.5)

## 2015-08-23 LAB — CBC WITH DIFFERENTIAL/PLATELET
BASOS: 1 %
Basophils Absolute: 0.1 10*3/uL (ref 0.0–0.2)
EOS (ABSOLUTE): 0.3 10*3/uL (ref 0.0–0.4)
EOS: 4 %
HEMATOCRIT: 46.3 % (ref 37.5–51.0)
Hemoglobin: 14.8 g/dL (ref 12.6–17.7)
IMMATURE GRANULOCYTES: 0 %
Immature Grans (Abs): 0 10*3/uL (ref 0.0–0.1)
LYMPHS ABS: 1.7 10*3/uL (ref 0.7–3.1)
Lymphs: 24 %
MCH: 30.8 pg (ref 26.6–33.0)
MCHC: 32 g/dL (ref 31.5–35.7)
MCV: 97 fL (ref 79–97)
MONOS ABS: 0.5 10*3/uL (ref 0.1–0.9)
Monocytes: 7 %
NEUTROS ABS: 4.5 10*3/uL (ref 1.4–7.0)
Neutrophils: 64 %
Platelets: 158 10*3/uL (ref 150–379)
RBC: 4.8 x10E6/uL (ref 4.14–5.80)
RDW: 13.1 % (ref 12.3–15.4)
WBC: 7.1 10*3/uL (ref 3.4–10.8)

## 2015-08-26 ENCOUNTER — Telehealth: Payer: Self-pay

## 2015-08-26 NOTE — Telephone Encounter (Signed)
-----   Message from [place], [place] sent at 08/23/2015  4:00 PM EDT ----- All blood tests normal. Continue efforts to drink plenty of fluids and may use antihistamine (like Meclizine - OTC) for any dizziness. Recheck if no better in 10-14 days.

## 2015-08-26 NOTE — Telephone Encounter (Signed)
Patient advised as directed below.  Thanks,  -Joseline 

## 2015-09-30 ENCOUNTER — Ambulatory Visit (INDEPENDENT_AMBULATORY_CARE_PROVIDER_SITE_OTHER): Payer: BLUE CROSS/BLUE SHIELD | Admitting: Family Medicine

## 2015-09-30 ENCOUNTER — Encounter: Payer: Self-pay | Admitting: Family Medicine

## 2015-09-30 VITALS — BP 128/72 | HR 104 | Temp 98.1°F | Wt 256.0 lb

## 2015-09-30 DIAGNOSIS — Z87898 Personal history of other specified conditions: Secondary | ICD-10-CM

## 2015-09-30 DIAGNOSIS — Z8679 Personal history of other diseases of the circulatory system: Secondary | ICD-10-CM | POA: Diagnosis not present

## 2015-09-30 DIAGNOSIS — R030 Elevated blood-pressure reading, without diagnosis of hypertension: Secondary | ICD-10-CM

## 2015-09-30 DIAGNOSIS — F419 Anxiety disorder, unspecified: Secondary | ICD-10-CM

## 2015-09-30 DIAGNOSIS — IMO0001 Reserved for inherently not codable concepts without codable children: Secondary | ICD-10-CM

## 2015-09-30 MED ORDER — METOPROLOL SUCCINATE ER 25 MG PO TB24: 25.0000 mg | ORAL_TABLET | Freq: Every day | ORAL | 3 refills | Status: DC

## 2015-09-30 MED ORDER — PAROXETINE HCL 20 MG PO TABS: 20.0000 mg | ORAL_TABLET | Freq: Every day | ORAL | 3 refills | Status: DC

## 2015-09-30 MED ORDER — LORAZEPAM 0.5 MG PO TABS: 0.5000 mg | ORAL_TABLET | Freq: Two times a day (BID) | ORAL | 1 refills | Status: DC | PRN

## 2015-09-30 NOTE — Progress Notes (Signed)
Shaun Griffith  MRN: [id number] DOB: [birth year]  Subjective:  HPI   The patient is a 55 year old male who presents for follow up after being started on Paxil.  He states that he is having less GI side effects from this medicine than he did with Zoloft.  However, he states it does not seem to be working as well.  Patient Active Problem List   Diagnosis Date Noted  . Hypertension 07/01/2015  . Anxiety disorder 02/18/2015  . Borderline diabetes 10/22/2014  . Chest discomfort 10/22/2014  . Groin pain 10/22/2014  . Excessive urination at night 10/22/2014  . Obstructive apnea 10/22/2014  . Snores 10/22/2014  . 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  Social History   Social History  . Marital status: Divorced    Spouse name: N/A  . Number of children: N/A  . Years of education: N/A   Occupational History  . Not on file.   Social History Main Topics  . Smoking status: Former Smoker    Types: Cigarettes  . Smokeless tobacco: Never Used     Comment: quit 2017  . Alcohol use 0.0 oz/week     Comment: Moderate alcohol use, drinks beer 3-4 days a week; drinks 8-10 beers  . Drug use: No  . Sexual activity: Not on file   Other Topics Concern  . Not on file   Social History Narrative  . No narrative on file    Outpatient Medications Prior to Visit  Medication Sig Dispense Refill  . ibuprofen (ADVIL) 200 MG tablet Take by mouth.    Marland Kitchen LORazepam (ATIVAN) 0.5 MG tablet Take 1 tablet (0.5 mg total) by mouth 2 (two) times daily as needed for anxiety. 30 tablet 1  . metoprolol succinate (TOPROL-XL) 25 MG 24 hr tablet Take 1 tablet (25 mg total) by mouth daily. 30 tablet 3  . naproxen sodium (ANAPROX) 220 MG tablet Take 220 mg by mouth 2 (two) times daily with a meal.    . PARoxetine (PAXIL) 10 MG tablet Take 1 tablet (10 mg total) by mouth daily. 30 tablet 3  . sildenafil (REVATIO) 20 MG tablet 2-4 tablets by mouth 1-4 hours prior  to intercourse - limit dosing to once in 24 hours. 30 tablet 0   No facility-administered medications prior to visit.     Allergies  Allergen Reactions  . Sulfadimethoxine Nausea Only    flu like symptoms    Review of Systems  Respiratory: Negative for cough, shortness of breath and wheezing.   Cardiovascular: Negative for chest pain, palpitations, orthopnea, claudication, leg swelling and PND.  Neurological: Positive for dizziness. Negative for headaches.  Psychiatric/Behavioral: Positive for depression and memory loss. Negative for hallucinations, substance abuse and suicidal ideas. The patient is nervous/anxious and has insomnia.    Objective:  BP 128/72 (BP Location: Left Arm, Patient Position: Sitting, Cuff Size: Large)   Pulse (!) 104   Temp 98.1 F (36.7 C) (Oral)   Wt 256 lb (116.1 kg)   BMI 31.58 kg/m   Physical Exam  Constitutional: He is oriented to person, place, and time and well-developed, well-nourished, and in no distress.  HENT:  Head: Normocephalic.  Right Ear: External ear normal.  Left Ear: External ear normal.  Nose: Nose normal.  Mouth/Throat: Oropharynx is clear and moist.  Eyes: Conjunctivae and EOM are normal.  Neck: Neck supple.  Cardiovascular: Normal rate and regular rhythm.   No  murmur heard. Pulmonary/Chest: Breath sounds normal.  Abdominal: Bowel sounds are normal.  Lymphadenopathy:    He has no cervical adenopathy.  Neurological: He is alert and oriented to person, place, and time.  Psychiatric: Memory, affect and judgment normal. His mood appears anxious.    Assessment and Plan :  1. Anxiety disorder, unspecified anxiety disorder type No problems with switch to Paroxetine from Sertraline. Does not feel the 10 mg is sufficient. Still using Lorazepam 0.5 mg qd. May use up to twice a day and will increase Paroxetine to 20 mg qd. Recheck in 3 months. - PARoxetine (PAXIL) 20 MG tablet; Take 1 tablet (20 mg total) by mouth daily.  Dispense:  30 tablet; Refill: 3 - LORazepam (ATIVAN) 0.5 MG tablet; Take 1 tablet (0.5 mg total) by mouth 2 (two) times daily as needed for anxiety.  Dispense: 30 tablet; Refill: 1  2. History of palpitations Stable and well controlled with use of Metoprolol daily. Will refill this medication and recheck in 3 months. - metoprolol succinate (TOPROL-XL) 25 MG 24 hr tablet; Take 1 tablet (25 mg total) by mouth daily.  Dispense: 30 tablet; Refill: 3  3. Elevated blood pressure No elevation today. Continue Metoprolol. - metoprolol succinate (TOPROL-XL) 25 MG 24 hr tablet; Take 1 tablet (25 mg total) by mouth daily.  Dispense: 30 tablet; Refill: [facility] 09/30/2015 11:22 AM

## 2015-12-12 ENCOUNTER — Other Ambulatory Visit: Payer: Self-pay | Admitting: Family Medicine

## 2015-12-12 NOTE — Telephone Encounter (Signed)
LOV 09/30/2015. Has appointment 12/31/2015. Last refill 09/30/2015. [name], CMA

## 2015-12-12 NOTE — Telephone Encounter (Signed)
Pt needs refill on his ativan 0.5mg  generic.  He uses [facility]  His call back is [phone]  Thanks, Con Memos

## 2015-12-13 MED ORDER — LORAZEPAM 0.5 MG PO TABS: 0.5000 mg | ORAL_TABLET | Freq: Two times a day (BID) | ORAL | 1 refills | Status: DC | PRN

## 2015-12-13 NOTE — Telephone Encounter (Signed)
Pt called to see if Rx for Ativan has been sent. Pt request a call back to update. Thanks TNP

## 2015-12-13 NOTE — Telephone Encounter (Signed)
Advise patient Ativan refill available for pick up at the front desk. Has appointment on 12-31-15.

## 2015-12-16 NOTE — Telephone Encounter (Signed)
RX called in at [facility]  

## 2015-12-31 ENCOUNTER — Ambulatory Visit (INDEPENDENT_AMBULATORY_CARE_PROVIDER_SITE_OTHER): Payer: BLUE CROSS/BLUE SHIELD | Admitting: Family Medicine

## 2015-12-31 ENCOUNTER — Encounter: Payer: Self-pay | Admitting: Family Medicine

## 2015-12-31 VITALS — BP 130/86 | HR 106 | Temp 98.2°F | Resp 16 | Ht 76.0 in | Wt 268.0 lb

## 2015-12-31 DIAGNOSIS — Z1211 Encounter for screening for malignant neoplasm of colon: Secondary | ICD-10-CM | POA: Diagnosis not present

## 2015-12-31 DIAGNOSIS — G4733 Obstructive sleep apnea (adult) (pediatric): Secondary | ICD-10-CM | POA: Diagnosis not present

## 2015-12-31 DIAGNOSIS — I10 Essential (primary) hypertension: Secondary | ICD-10-CM

## 2015-12-31 DIAGNOSIS — R7303 Prediabetes: Secondary | ICD-10-CM

## 2015-12-31 DIAGNOSIS — F411 Generalized anxiety disorder: Secondary | ICD-10-CM | POA: Diagnosis not present

## 2015-12-31 DIAGNOSIS — Z0000 Encounter for general adult medical examination without abnormal findings: Secondary | ICD-10-CM

## 2015-12-31 DIAGNOSIS — Z23 Encounter for immunization: Secondary | ICD-10-CM | POA: Diagnosis not present

## 2015-12-31 MED ORDER — METOPROLOL SUCCINATE ER 25 MG PO TB24: 25.0000 mg | ORAL_TABLET | Freq: Every day | ORAL | 12 refills | Status: DC

## 2015-12-31 MED ORDER — PAROXETINE HCL 20 MG PO TABS: 20.0000 mg | ORAL_TABLET | Freq: Every day | ORAL | 12 refills | Status: DC

## 2015-12-31 NOTE — Progress Notes (Signed)
Patient: Shaun Griffith, Male    DOB: 1960/04/11, 55 y.o.   MRN: [id number] Visit Date: 12/31/2015  Today's Provider: Vernie Murders, PA   Chief Complaint  Patient presents with  . Annual Exam  . Anxiety   Subjective:    Annual physical exam Shaun Griffith is a 55 y.o. male who presents today for health maintenance and complete physical. He feels fairly well. He reports exercising none. He reports he is sleeping fairly well. 04/07/13 PSA- 0.4 04/07/13 EKG ----------------------------------------------------------------- Patient c/o back pain for several weeks. Patient denies any injuries. Patient reports that he has been taking ibuprofen and Naproxen and reports mild relief.   anxiety, Follow-up  He  was last seen for this 3 months ago. Changes made at last visit include no changes.   He reports excellent compliance with treatment. He is not having side effects.   He reports good tolerance of treatment. Current symptoms include: depressed mood and difficulty concentrating He feels he is Worse since last visit. Patient reports his friend passed away about 1 week ago in MVA. ------------------------------------------------------------------------     Review of Systems  Constitutional: Negative.   Eyes: Negative.   Respiratory: Positive for apnea, chest tightness and shortness of breath.   Cardiovascular: Negative.   Gastrointestinal: Negative.   Endocrine: Negative.   Genitourinary: Negative.   Musculoskeletal: Positive for myalgias.  Skin: Negative.   Allergic/Immunologic: Positive for environmental allergies.  Neurological: Positive for dizziness and light-headedness.  Hematological: Negative.   Psychiatric/Behavioral: The patient is nervous/anxious.     Social History      He  reports that he has quit smoking. His smoking use included Cigarettes. He has never used smokeless tobacco. He reports that he drinks alcohol. He reports that he does not  use drugs.       Social History   Social History  . Marital status: Divorced    Spouse name: N/A  . Number of children: N/A  . Years of education: N/A   Social History Main Topics  . Smoking status: Former Smoker    Types: Cigarettes  . Smokeless tobacco: Never Used     Comment: quit 2017  . Alcohol use 0.0 oz/week     Comment: Moderate alcohol use, drinks beer 3-4 days a week; drinks 8-10 beers  . Drug use: No  . Sexual activity: Not Asked   Other Topics Concern  . None   Social History Narrative  . None    History reviewed. No pertinent past medical history.   Patient Active Problem List   Diagnosis Date Noted  . Hypertension 07/01/2015  . Anxiety disorder 02/18/2015  . Borderline diabetes 10/22/2014  . Chest discomfort 10/22/2014  . Groin pain 10/22/2014  . Excessive urination at night 10/22/2014  . Obstructive apnea 10/22/2014  . Snores 10/22/2014  . 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 BACK SURGERY  1990   HNP L5  . HERNIA REPAIR  1984   right groin  . KNEE SURGERY Left 1992    Family History        Family Status  Relation Status  . Mother Deceased at age 45   Died from a stroke  . Father Deceased at age 44   Had kidney failure  . Maternal Grandmother Deceased  . Paternal Grandmother Deceased  . Paternal Grandfather Deceased        His family history includes  Cancer in his father; Depression in his mother; Diabetes in his mother.    Allergies  Allergen Reactions  . Sulfadimethoxine Nausea Only    flu like symptoms    Current Meds  Medication Sig  . b complex-C-folic acid 1 MG capsule Take 1 capsule by mouth daily.  Marland Kitchen ibuprofen (ADVIL) 200 MG tablet Take by mouth.  Marland Kitchen LORazepam (ATIVAN) 0.5 MG tablet Take 1 tablet (0.5 mg total) by mouth 2 (two) times daily as needed for anxiety.  . metoprolol succinate (TOPROL-XL) 25 MG 24 hr tablet Take 1 tablet (25 mg total) by  mouth daily.  . naproxen sodium (ANAPROX) 220 MG tablet Take 220 mg by mouth 2 (two) times daily with a meal.  . PARoxetine (PAXIL) 20 MG tablet Take 1 tablet (20 mg total) by mouth daily.  . sildenafil (REVATIO) 20 MG tablet 2-4 tablets by mouth 1-4 hours prior to intercourse - limit dosing to once in 24 hours.    Patient Care Team: Margo Common, PA as PCP - General (Physician Assistant)     Objective:   Vitals: BP 130/86 (BP Location: Left Arm, Patient Position: Sitting, Cuff Size: Large)   Pulse (!) 106   Temp 98.2 F (36.8 C) (Oral)   Resp 16   Ht 6\' 4"  (1.93 m)   Wt 268 lb (121.6 kg)   SpO2 97%   BMI 32.62 kg/m   Wt Readings from Last 3 Encounters:  12/31/15 268 lb (121.6 kg)  09/30/15 256 lb (116.1 kg)  08/22/15 257 lb (116.6 kg)    Physical Exam  Constitutional: He is oriented to person, place, and time. He appears well-developed and well-nourished.  HENT:  Head: Normocephalic and atraumatic.  Right Ear: External ear normal.  Left Ear: External ear normal.  Mouth/Throat: Oropharynx is clear and moist.  Eyes: Conjunctivae and EOM are normal. Pupils are equal, round, and reactive to light.  Neck: Normal range of motion. Neck supple.  Cardiovascular: Normal rate, regular rhythm and normal heart sounds.   Pulmonary/Chest: Effort normal and breath sounds normal.  Abdominal: Soft. Bowel sounds are normal.  Genitourinary: Rectum normal, prostate normal and penis normal. Rectal exam shows guaiac negative stool.  Musculoskeletal: Normal range of motion.  Stiffness in spine ROM with well healed scar from laminectomy in the past. Pain in right lower back to test SLR's to 80 degrees. Occasional tingling in toes - no permanent neuropathy present.  Neurological: He is alert and oriented to person, place, and time. He has normal reflexes.  Skin: Skin is warm and dry.  Psychiatric: His behavior is normal. Judgment and thought content normal. His mood appears anxious. His  affect is blunt.   Depression Screen PHQ 2/9 Scores 12/31/2015  PHQ - 2 Score 2  PHQ- 9 Score 3    Assessment & Plan:     Routine Health Maintenance and Physical Exam  Exercise Activities and Dietary recommendations Goals    Encouraged to walk for exercise as chronic back and knee pain allows. Control caloric intake and lose some weight.      Immunization History  Administered Date(s) Administered  . Influenza-Unspecified 01/01/2015    Health Maintenance  Topic Date Due  . Hepatitis C Screening  22-Sep-1960  . HIV Screening  10/25/1974  . TETANUS/TDAP  10/25/1978  . COLONOSCOPY  10/24/2009  . INFLUENZA VACCINE  10/08/2015      Discussed health benefits of physical activity, and encouraged him to engage in regular exercise appropriate for his age  and condition.    -------------------------------------------------------------------- 1. Annual physical exam General health stable. Will check screening tests for HIV, Hep C and prostate cancer. No suspicious signs on exam today. Follow above guidelines. - HIV antibody - PSA - Hepatitis C antibody  2. Generalized anxiety disorder Recent flare with the death of a close friend a week ago. Will refill Paroxetine and may use Lorazepam prn as prescribed. Recheck routine labs and follow up pending reports. - CBC with Differential/Platelet - Comprehensive metabolic panel - TSH - PARoxetine (PAXIL) 20 MG tablet; Take 1 tablet (20 mg total) by mouth daily.  Dispense: 30 tablet; Refill: 12  3. Colon cancer screening Due for colonoscopy. Did not get it accomplished last year. Ready to reschedule now. - Ambulatory referral to Gastroenterology  4. Essential hypertension Tolerating Metoprolol succinate 25 mg qd without side effects. No palpitations or chest discomfort recently. Will recheck labs and follow pending reports. - CBC with Differential/Platelet - Comprehensive metabolic panel - TSH - Lipid panel - metoprolol succinate  (TOPROL-XL) 25 MG 24 hr tablet; Take 1 tablet (25 mg total) by mouth daily.  Dispense: 30 tablet; Refill: 12  5. Obstructive apnea Diagnosed by sleep study in 2015. Presently using CPAP at 10 cm H2O pressure each night. Feels he sleeps well.  6. Borderline diabetes History of past borderline/pre-diabetes Hgb A1C Feb. 2015. Will recheck blood chemistry and CBC. Encouraged to watch diet and lose weight. - CBC with Differential/Platelet - Comprehensive metabolic panel - Hemoglobin A1c - Lipid panel  7. Need for Tdap vaccination - Tdap vaccine greater than or equal to 7yo IM  8. Need for influenza vaccination - Flu Vaccine QUAD 36+ mos IM    Vernie Murders, PA  [facility]

## 2016-01-01 ENCOUNTER — Telehealth: Payer: Self-pay

## 2016-01-01 LAB — CBC WITH DIFFERENTIAL/PLATELET
Basophils Absolute: 0 10*3/uL (ref 0.0–0.2)
Basos: 1 %
EOS (ABSOLUTE): 0.2 10*3/uL (ref 0.0–0.4)
EOS: 3 %
HEMOGLOBIN: 15.1 g/dL (ref 12.6–17.7)
Hematocrit: 44.1 % (ref 37.5–51.0)
IMMATURE GRANS (ABS): 0 10*3/uL (ref 0.0–0.1)
Immature Granulocytes: 0 %
LYMPHS ABS: 1.5 10*3/uL (ref 0.7–3.1)
Lymphs: 24 %
MCH: 33.3 pg — AB (ref 26.6–33.0)
MCHC: 34.2 g/dL (ref 31.5–35.7)
MCV: 97 fL (ref 79–97)
MONOCYTES: 7 %
MONOS ABS: 0.4 10*3/uL (ref 0.1–0.9)
NEUTROS PCT: 65 %
Neutrophils Absolute: 4 10*3/uL (ref 1.4–7.0)
PLATELETS: 160 10*3/uL (ref 150–379)
RBC: 4.53 x10E6/uL (ref 4.14–5.80)
RDW: 13.7 % (ref 12.3–15.4)
WBC: 6.2 10*3/uL (ref 3.4–10.8)

## 2016-01-01 LAB — PSA: Prostate Specific Ag, Serum: 0.6 ng/mL (ref 0.0–4.0)

## 2016-01-01 LAB — COMPREHENSIVE METABOLIC PANEL
A/G RATIO: 1.2 (ref 1.2–2.2)
ALBUMIN: 4 g/dL (ref 3.5–5.5)
ALT: 18 IU/L (ref 0–44)
AST: 25 IU/L (ref 0–40)
Alkaline Phosphatase: 61 IU/L (ref 39–117)
BILIRUBIN TOTAL: 0.3 mg/dL (ref 0.0–1.2)
BUN / CREAT RATIO: 15 (ref 9–20)
BUN: 12 mg/dL (ref 6–24)
CHLORIDE: 103 mmol/L (ref 96–106)
CO2: 26 mmol/L (ref 18–29)
Calcium: 9.1 mg/dL (ref 8.7–10.2)
Creatinine, Ser: 0.81 mg/dL (ref 0.76–1.27)
GFR calc non Af Amer: 99 mL/min/{1.73_m2} (ref >59)
GFR, EST AFRICAN AMERICAN: 115 mL/min/{1.73_m2} (ref >59)
Globulin, Total: 3.3 g/dL (ref 1.5–4.5)
Glucose: 89 mg/dL (ref 65–99)
POTASSIUM: 4.7 mmol/L (ref 3.5–5.2)
SODIUM: 142 mmol/L (ref 134–144)
TOTAL PROTEIN: 7.3 g/dL (ref 6.0–8.5)

## 2016-01-01 LAB — LIPID PANEL
CHOL/HDL RATIO: 2.6 ratio (ref 0.0–5.0)
Cholesterol, Total: 174 mg/dL (ref 100–199)
HDL: 67 mg/dL (ref >39)
LDL CALC: 89 mg/dL (ref 0–99)
TRIGLYCERIDES: 92 mg/dL (ref 0–149)
VLDL Cholesterol Cal: 18 mg/dL (ref 5–40)

## 2016-01-01 LAB — HEMOGLOBIN A1C
Est. average glucose Bld gHb Est-mCnc: 103 mg/dL
Hgb A1c MFr Bld: 5.2 % (ref 4.8–5.6)

## 2016-01-01 LAB — HIV ANTIBODY (ROUTINE TESTING W REFLEX): HIV Screen 4th Generation wRfx: NONREACTIVE

## 2016-01-01 LAB — TSH: TSH: 2.16 u[IU]/mL (ref 0.450–4.500)

## 2016-01-01 LAB — HEPATITIS C ANTIBODY: Hep C Virus Ab: <0.1 s/co ratio (ref 0.0–0.9)

## 2016-01-01 NOTE — Telephone Encounter (Signed)
LMTCB

## 2016-01-01 NOTE — Telephone Encounter (Signed)
-----   Message from [place], [place] sent at 01/01/2016  1:56 PM EDT ----- All blood tests normal. No sign of diabetes now. Continue present medications and recheck in 6 months.

## 2016-01-01 NOTE — Telephone Encounter (Signed)
Patient advised.

## 2016-01-09 ENCOUNTER — Other Ambulatory Visit: Payer: Self-pay

## 2016-01-09 ENCOUNTER — Encounter: Payer: Self-pay | Admitting: Family Medicine

## 2016-01-09 ENCOUNTER — Telehealth: Payer: Self-pay

## 2016-01-09 ENCOUNTER — Telehealth: Payer: Self-pay | Admitting: Family Medicine

## 2016-01-09 NOTE — Telephone Encounter (Signed)
Gastroenterology Pre-Procedure Review  Request Date: 02/13/2016 Requesting Physician: Dr. Natale Milch  PATIENT REVIEW QUESTIONS: The patient responded to the following health history questions as indicated:    1. Are you having any GI issues? no 2. Do you have a personal history of Polyps? no 3. Do you have a family history of Colon Cancer or Polyps? no 4. Diabetes Mellitus? no 5. Joint replacements in the past 12 months?no 6. Major health problems in the past 3 months?no 7. Any artificial heart valves, MVP, or defibrillator?no    MEDICATIONS & ALLERGIES:    Patient reports the following regarding taking any anticoagulation/antiplatelet therapy:   Plavix, Coumadin, Eliquis, Xarelto, Lovenox, Pradaxa, Brilinta, or Effient? no Aspirin? no  Patient confirms/reports the following medications:  Current Outpatient Prescriptions  Medication Sig Dispense Refill  . b complex-C-folic acid 1 MG capsule Take 1 capsule by mouth daily.    Marland Kitchen ibuprofen (ADVIL) 200 MG tablet Take by mouth.    Marland Kitchen LORazepam (ATIVAN) 0.5 MG tablet Take 1 tablet (0.5 mg total) by mouth 2 (two) times daily as needed for anxiety. 30 tablet 1  . metoprolol succinate (TOPROL-XL) 25 MG 24 hr tablet Take 1 tablet (25 mg total) by mouth daily. 30 tablet 12  . naproxen sodium (ANAPROX) 220 MG tablet Take 220 mg by mouth 2 (two) times daily with a meal.    . PARoxetine (PAXIL) 20 MG tablet Take 1 tablet (20 mg total) by mouth daily. 30 tablet 12  . sildenafil (REVATIO) 20 MG tablet 2-4 tablets by mouth 1-4 hours prior to intercourse - limit dosing to once in 24 hours. 30 tablet 0   No current facility-administered medications for this visit.     Patient confirms/reports the following allergies:  Allergies  Allergen Reactions  . Sulfadimethoxine Nausea Only    flu like symptoms    No orders of the defined types were placed in this encounter.   AUTHORIZATION INFORMATION Primary Insurance: 1D#: Group #:  Secondary  Insurance: 1D#: Group #:  SCHEDULE INFORMATION: Date: 02/13/2016 Time: Location: [facility]

## 2016-01-09 NOTE — Telephone Encounter (Signed)
Screening Colonoscopy Z12.11 [facility] 02/13/16 Dr. Alvina Filbert Pre cert is not required

## 2016-01-09 NOTE — Telephone Encounter (Signed)
Pt called in saying is still having back pain.    He was in about a week ago and seen Simona Huh for a CPE. Pt states they discussed his back pain.  Patient wants to know if he can get a muscle relaxer or something else called in for the pain.  He uses [facility] back is [phone]  Thanks, Con Memos

## 2016-01-10 ENCOUNTER — Other Ambulatory Visit: Payer: Self-pay | Admitting: Family Medicine

## 2016-01-10 DIAGNOSIS — S39012A Strain of muscle, fascia and tendon of lower back, initial encounter: Secondary | ICD-10-CM

## 2016-01-10 MED ORDER — METHOCARBAMOL 500 MG PO TABS: 500.0000 mg | ORAL_TABLET | Freq: Four times a day (QID) | ORAL | 0 refills | Status: DC

## 2016-01-10 NOTE — Telephone Encounter (Signed)
Patient has been advised. KW 

## 2016-01-10 NOTE — Telephone Encounter (Signed)
Medication sent to the pharmacy. Remember this can cause sleepiness and he should be careful about driving when he takes it.

## 2016-01-14 ENCOUNTER — Telehealth: Payer: Self-pay | Admitting: Gastroenterology

## 2016-01-14 NOTE — Telephone Encounter (Signed)
Patient called to cancel his colonoscopy due to a back injury. He will call us back to reschedule

## 2016-01-16 NOTE — Telephone Encounter (Signed)
Colonoscopy has been canceled. 

## 2016-02-13 ENCOUNTER — Ambulatory Visit: Admit: 2016-02-13 | Payer: Self-pay

## 2016-02-13 SURGERY — COLONOSCOPY WITH PROPOFOL
Anesthesia: General

## 2016-02-24 ENCOUNTER — Other Ambulatory Visit: Payer: Self-pay | Admitting: Family Medicine

## 2016-02-24 ENCOUNTER — Telehealth: Payer: Self-pay

## 2016-02-24 MED ORDER — LORAZEPAM 0.5 MG PO TABS: 0.5000 mg | ORAL_TABLET | Freq: Two times a day (BID) | ORAL | 1 refills | Status: DC | PRN

## 2016-02-24 NOTE — Telephone Encounter (Signed)
RX called in at [facility]  

## 2016-02-24 NOTE — Telephone Encounter (Signed)
Phone in refill of Lorazepam as listed in chart. Thanks !

## 2016-02-24 NOTE — Telephone Encounter (Signed)
Refill request received from [facility] requesting Lorazepam 0.5 mg tablet

## 2016-07-09 ENCOUNTER — Other Ambulatory Visit: Payer: Self-pay | Admitting: Family Medicine

## 2016-07-09 NOTE — Telephone Encounter (Signed)
[facility] faxed a request for the following medication. Thanks CC  LORazepam (ATIVAN) 0.5 MG tablet  Take 1 tablet by mouth twice daily as needed for anxiety/panic.

## 2016-07-10 ENCOUNTER — Other Ambulatory Visit: Payer: Self-pay | Admitting: Family Medicine

## 2016-07-10 DIAGNOSIS — F411 Generalized anxiety disorder: Secondary | ICD-10-CM

## 2016-07-10 MED ORDER — LORAZEPAM 0.5 MG PO TABS: 0.5000 mg | ORAL_TABLET | Freq: Two times a day (BID) | ORAL | 1 refills | Status: DC | PRN

## 2016-07-10 NOTE — Telephone Encounter (Signed)
Phone in refill of Lorazepam as listed in medications.

## 2016-07-10 NOTE — Telephone Encounter (Signed)
rx called in-aa 

## 2016-09-11 ENCOUNTER — Telehealth: Payer: Self-pay | Admitting: Family Medicine

## 2016-09-11 ENCOUNTER — Other Ambulatory Visit: Payer: Self-pay | Admitting: Family Medicine

## 2016-09-11 DIAGNOSIS — F411 Generalized anxiety disorder: Secondary | ICD-10-CM

## 2016-09-11 MED ORDER — LORAZEPAM 0.5 MG PO TABS: 0.5000 mg | ORAL_TABLET | Freq: Two times a day (BID) | ORAL | 1 refills | Status: DC | PRN

## 2016-09-11 NOTE — Telephone Encounter (Signed)
[facility] faxed refill request for the following medications: LORazepam (ATIVAN) 0.5 MG tablet  Please advise. Thanks TNP

## 2016-09-11 NOTE — Telephone Encounter (Signed)
Phone in to pharmacy as authorized in medications list.

## 2016-09-11 NOTE — Telephone Encounter (Signed)
rx called in-aa 

## 2016-11-10 ENCOUNTER — Other Ambulatory Visit: Payer: Self-pay | Admitting: Family Medicine

## 2016-11-10 NOTE — Telephone Encounter (Signed)
Phone in refill of Lorazepam and remind patient to schedule annual physical in October.

## 2016-11-11 ENCOUNTER — Other Ambulatory Visit: Payer: Self-pay | Admitting: Family Medicine

## 2016-11-11 NOTE — Telephone Encounter (Signed)
RX called in at [facility]. Patient advised. Appointment scheduled.

## 2016-12-31 ENCOUNTER — Encounter: Payer: Self-pay | Admitting: Family Medicine

## 2016-12-31 ENCOUNTER — Other Ambulatory Visit: Payer: Self-pay | Admitting: Family Medicine

## 2016-12-31 ENCOUNTER — Ambulatory Visit (INDEPENDENT_AMBULATORY_CARE_PROVIDER_SITE_OTHER): Payer: BLUE CROSS/BLUE SHIELD | Admitting: Family Medicine

## 2016-12-31 VITALS — BP 124/80 | HR 88 | Temp 98.3°F | Resp 16 | Ht 76.0 in | Wt 281.0 lb

## 2016-12-31 DIAGNOSIS — F411 Generalized anxiety disorder: Secondary | ICD-10-CM

## 2016-12-31 DIAGNOSIS — N529 Male erectile dysfunction, unspecified: Secondary | ICD-10-CM | POA: Diagnosis not present

## 2016-12-31 DIAGNOSIS — G4733 Obstructive sleep apnea (adult) (pediatric): Secondary | ICD-10-CM

## 2016-12-31 DIAGNOSIS — Z6834 Body mass index (BMI) 34.0-34.9, adult: Secondary | ICD-10-CM | POA: Diagnosis not present

## 2016-12-31 DIAGNOSIS — Z1211 Encounter for screening for malignant neoplasm of colon: Secondary | ICD-10-CM | POA: Diagnosis not present

## 2016-12-31 DIAGNOSIS — Z0000 Encounter for general adult medical examination without abnormal findings: Secondary | ICD-10-CM

## 2016-12-31 DIAGNOSIS — I10 Essential (primary) hypertension: Secondary | ICD-10-CM

## 2016-12-31 DIAGNOSIS — R7303 Prediabetes: Secondary | ICD-10-CM

## 2016-12-31 MED ORDER — SILDENAFIL CITRATE 20 MG PO TABS: ORAL_TABLET | ORAL | 0 refills | Status: DC

## 2016-12-31 NOTE — Progress Notes (Signed)
Patient: Shaun Griffith, Male    DOB: Feb 21, 1961, 56 y.o.   MRN: [id number] Visit Date: 12/31/2016  Today's Provider: Vernie Murders, PA   Chief Complaint  Patient presents with  . Annual Exam  . Hypertension   Subjective:    Annual physical exam Shaun Griffith is a 56 y.o. male who presents today for health maintenance and complete physical. He feels fairly well. He reports exercising none. He reports he is sleeping fairly well.  ----------------------------------------------------------------    Hypertension, follow-up:  BP Readings from Last 3 Encounters:  12/31/16 124/80  12/31/15 130/86  09/30/15 128/72    He was last seen for hypertension 1 years ago.  BP at that visit was 130/80. Management since that visit includes; labs checked, no changes.He reports good compliance with treatment. He is not having side effects. none He is not exercising. He is not adherent to low salt diet.   Outside blood pressures are normal. He is experiencing none.  Patient denies none.   Cardiovascular risk factors include none.  Use of agents associated with hypertension: none.   ----------------------------------------------------------------   Generalized anxiety disorder From 12/31/2015-refilled Paroxetine and may use Lorazepam prn as prescribed.   Obstructive apnea From 12/31/2015-no changes.  Borderline diabetes From 12/31/2015-labs checked, no changes.    Review of Systems  Constitutional: Positive for fatigue.  Respiratory: Positive for apnea.   All other systems reviewed and are negative.   Social History      He  reports that he has quit smoking. His smoking use included Cigarettes. He has never used smokeless tobacco. He reports that he drinks alcohol. He reports that he does not use drugs.       Social History   Social History  . Marital status: Divorced    Spouse name: N/A  . Number of children: N/A  . Years of education: N/A   Social  History Main Topics  . Smoking status: Former Smoker    Types: Cigarettes  . Smokeless tobacco: Never Used     Comment: quit 2017  . Alcohol use 0.0 oz/week     Comment: Moderate alcohol use, drinks beer 3-4 days a week; drinks 8-10 beers  . Drug use: No  . Sexual activity: Not Asked   Other Topics Concern  . None   Social History Narrative  . None    History reviewed. No pertinent past medical history.   Patient Active Problem List   Diagnosis Date Noted  . Hypertension 07/01/2015  . Anxiety disorder 02/18/2015  . Borderline diabetes 10/22/2014  . Chest discomfort 10/22/2014  . Groin pain 10/22/2014  . Excessive urination at night 10/22/2014  . Obstructive apnea 10/22/2014  . Snores 10/22/2014  . 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 BACK SURGERY  1990   HNP L5  . HERNIA REPAIR  1984   right groin  . KNEE SURGERY Left 1992    Family History        Family Status  Relation Status  . Mother Deceased at age 12       Died from a stroke  . Father Deceased at age 12       Had kidney failure  . MGM Deceased  . PGM Deceased  . PGF Deceased        His family history includes Cancer in his father; Depression in his mother; Diabetes in his mother. He was  adopted.     Allergies  Allergen Reactions  . Sulfadimethoxine Nausea Only    flu like symptoms    Current Outpatient Prescriptions:  .  b complex-C-folic acid 1 MG capsule, Take 1 capsule by mouth daily., Disp: , Rfl:  .  Carboxymethylcellulose Sodium (EYE DROPS OP), Apply to eye. Prescribed uses once a week for dry eyes, Disp: , Rfl:  .  ibuprofen (ADVIL) 200 MG tablet, Take by mouth., Disp: , Rfl:  .  LORazepam (ATIVAN) 0.5 MG tablet, TAKE 1 TABLET TWICE A DAY AS NEEDED, Disp: 30 tablet, Rfl: 1 .  metoprolol succinate (TOPROL-XL) 25 MG 24 hr tablet, Take 1 tablet (25 mg total) by mouth daily., Disp: 30 tablet, Rfl: 12 .  naproxen sodium  (ANAPROX) 220 MG tablet, Take 220 mg by mouth 2 (two) times daily with a meal., Disp: , Rfl:  .  PARoxetine (PAXIL) 20 MG tablet, Take 1 tablet (20 mg total) by mouth daily., Disp: 30 tablet, Rfl: 12 .  sildenafil (REVATIO) 20 MG tablet, 2-4 tablets by mouth 1-4 hours prior to intercourse - limit dosing to once in 24 hours. (Patient not taking: Reported on 12/31/2016), Disp: 30 tablet, Rfl: 0   Patient Care Team: Chrismon, Vickki Muff, PA as PCP - General (Physician Assistant)      Objective:   Vitals: BP 124/80 (BP Location: Right Arm, Patient Position: Sitting, Cuff Size: Large)   Pulse 88   Temp 98.3 F (36.8 C) (Oral)   Resp 16   Ht 6\' 4"  (1.93 m)   Wt 281 lb (127.5 kg)   SpO2 95%   BMI 34.20 kg/m    Wt Readings from Last 3 Encounters:  12/31/16 281 lb (127.5 kg)  12/31/15 268 lb (121.6 kg)  09/30/15 256 lb (116.1 kg)    Vitals:   12/31/16 1011  BP: 124/80  Pulse: 88  Resp: 16  Temp: 98.3 F (36.8 C)  TempSrc: Oral  SpO2: 95%  Weight: 281 lb (127.5 kg)  Height: 6\' 4"  (1.93 m)     Physical Exam  Constitutional: He is oriented to person, place, and time. He appears well-developed and well-nourished.  HENT:  Head: Normocephalic and atraumatic.  Right Ear: External ear normal.  Left Ear: External ear normal.  Nose: Nose normal.  Mouth/Throat: Oropharynx is clear and moist.  Eyes: Pupils are equal, round, and reactive to light. Conjunctivae and EOM are normal. Right eye exhibits no discharge.  Neck: Normal range of motion. Neck supple. No tracheal deviation present. No thyromegaly present.  Cardiovascular: Normal rate, regular rhythm, normal heart sounds and intact distal pulses.   No murmur heard. Pulmonary/Chest: Effort normal and breath sounds normal. No respiratory distress. He has no wheezes. He has no rales. He exhibits no tenderness.  Abdominal: Soft. He exhibits no distension and no mass. There is no tenderness. There is no rebound and no guarding.    Genitourinary: Rectum normal, prostate normal and penis normal. Rectal exam shows guaiac negative stool.  Musculoskeletal: Normal range of motion. He exhibits no edema or tenderness.  Lymphadenopathy:    He has no cervical adenopathy.  Neurological: He is alert and oriented to person, place, and time. He has normal reflexes. No cranial nerve deficit. He exhibits normal muscle tone. Coordination normal.  Skin: Skin is warm and dry. No rash noted. No erythema.  Psychiatric: He has a normal mood and affect. His behavior is normal. Judgment and thought content normal.   Depression Screen PHQ 2/9 Scores 12/31/2016  12/31/2015  PHQ - 2 Score 2 2  PHQ- 9 Score 6 3      Assessment & Plan:     Routine Health Maintenance and Physical Exam  Exercise Activities and Dietary recommendations Goals    Recommend low fat diet and 30 minute exercise program 3-4 days a week.      Immunization History  Administered Date(s) Administered  . Influenza,inj,Quad PF,6+ Mos 12/31/2015  . Influenza-Unspecified 01/01/2015  . Tdap 12/31/2015    Health Maintenance  Topic Date Due  . COLONOSCOPY  10/24/2009  . INFLUENZA VACCINE  10/07/2016  . TETANUS/TDAP  12/30/2025  . Hepatitis C Screening  Completed  . HIV Screening  Completed     Discussed health benefits of physical activity, and encouraged him to engage in regular exercise appropriate for his age and condition.    -------------------------------------------------------------------- 1. Annual physical exam Stable. Will get flu shot at [facility] soon. Remainder of immunizations up to date. Will schedule for screening colonoscopy and follow up pending diagnostic test results.  2. BMI 34.0-34.9,adult Has gained 13 lbs since 12-31-15. Recommend he reduce caloric intake and restart a 30 minute exercise program 3-4 days a week. Recheck labs and follow up pending reports. - CBC with Differential/Platelet - Comprehensive metabolic panel -  TSH - Hemoglobin A1c  3. Essential hypertension Stable on the Metoprolol Succinate 25 mg qd. No chest pains, dyspnea, peripheral edema or syncope. Recheck labs and continue this regimen. Limit sodium intake. - CBC with Differential/Platelet - Comprehensive metabolic panel - Lipid panel - TSH  4. Obstructive apnea Sleeping better on the CPAP at 10 cm H2O by mask at night  5. Generalized anxiety disorder Stable on Paroxetine and occasional Lorazepam once a day. Recheck labs and continue present regimen. - CBC with Differential/Platelet - Comprehensive metabolic panel - TSH  6. Borderline diabetes Has gained 13 lbs since a year ago. Recommend weight loss, regular exercise and check CBC, CMP and Hgb A1C. - CBC with Differential/Platelet - Comprehensive metabolic panel - Hemoglobin A1c  7. Erectile dysfunction, unspecified erectile dysfunction type Still having good response with prn use of Viagra. Prostate exam essentially normal. Will check PSA. - PSA  8. Colon cancer screening No known family history - adopted. Asymptomatic. - Ambulatory referral to Gastroenterology    Vernie Murders, [facility]

## 2017-01-01 LAB — CBC WITH DIFFERENTIAL/PLATELET
BASOS ABS: 81 {cells}/uL (ref 0–200)
Basophils Relative: 1.1 %
EOS ABS: 274 {cells}/uL (ref 15–500)
Eosinophils Relative: 3.7 %
HEMATOCRIT: 44.5 % (ref 38.5–50.0)
HEMOGLOBIN: 15.3 g/dL (ref 13.2–17.1)
Lymphs Abs: 1569 cells/uL (ref 850–3900)
MCH: 32.9 pg (ref 27.0–33.0)
MCHC: 34.4 g/dL (ref 32.0–36.0)
MCV: 95.7 fL (ref 80.0–100.0)
MONOS PCT: 7.6 %
MPV: 11.5 fL (ref 7.5–12.5)
NEUTROS ABS: 4914 {cells}/uL (ref 1500–7800)
Neutrophils Relative %: 66.4 %
Platelets: 150 10*3/uL (ref 140–400)
RBC: 4.65 10*6/uL (ref 4.20–5.80)
RDW: 11.9 % (ref 11.0–15.0)
Total Lymphocyte: 21.2 %
WBC mixed population: 562 cells/uL (ref 200–950)
WBC: 7.4 10*3/uL (ref 3.8–10.8)

## 2017-01-01 LAB — TSH: TSH: 1.81 m[IU]/L (ref 0.40–4.50)

## 2017-01-01 LAB — COMPLETE METABOLIC PANEL WITH GFR
AG Ratio: 1.4 (calc) (ref 1.0–2.5)
ALKALINE PHOSPHATASE (APISO): 53 U/L (ref 40–115)
ALT: 21 U/L (ref 9–46)
AST: 20 U/L (ref 10–35)
Albumin: 4.2 g/dL (ref 3.6–5.1)
BILIRUBIN TOTAL: 0.5 mg/dL (ref 0.2–1.2)
BUN: 9 mg/dL (ref 7–25)
CHLORIDE: 103 mmol/L (ref 98–110)
CO2: 26 mmol/L (ref 20–32)
Calcium: 9.1 mg/dL (ref 8.6–10.3)
Creat: 0.73 mg/dL (ref 0.70–1.33)
GFR, Est African American: 119 mL/min/{1.73_m2} (ref >=60)
GFR, Est Non African American: 103 mL/min/{1.73_m2} (ref >=60)
GLUCOSE: 95 mg/dL (ref 65–99)
Globulin: 3 g/dL (calc) (ref 1.9–3.7)
POTASSIUM: 4.2 mmol/L (ref 3.5–5.3)
Sodium: 138 mmol/L (ref 135–146)
Total Protein: 7.2 g/dL (ref 6.1–8.1)

## 2017-01-01 LAB — HEMOGLOBIN A1C
EAG (MMOL/L): 5.5 (calc)
HEMOGLOBIN A1C: 5.1 %{Hb} (ref <5.7)
Mean Plasma Glucose: 100 (calc)

## 2017-01-01 LAB — LIPID PANEL
CHOL/HDL RATIO: 2.8 (calc) (ref <5.0)
CHOLESTEROL: 155 mg/dL (ref <200)
HDL: 56 mg/dL (ref >40)
LDL Cholesterol (Calc): 83 mg/dL (calc)
NON-HDL CHOLESTEROL (CALC): 99 mg/dL (ref <130)
Triglycerides: 84 mg/dL (ref <150)

## 2017-01-01 LAB — PSA: PSA: 0.3 ng/mL (ref <=4.0)

## 2017-01-14 ENCOUNTER — Other Ambulatory Visit: Payer: Self-pay | Admitting: Family Medicine

## 2017-01-14 DIAGNOSIS — F411 Generalized anxiety disorder: Secondary | ICD-10-CM

## 2017-01-14 DIAGNOSIS — I10 Essential (primary) hypertension: Secondary | ICD-10-CM

## 2017-01-15 NOTE — Telephone Encounter (Signed)
Refills of paroxetine and Metoprolol sent to the pharmacy. Please phone in refill on Lorazepam as listed.

## 2017-01-15 NOTE — Telephone Encounter (Signed)
RX called in at [place]

## 2017-03-04 ENCOUNTER — Other Ambulatory Visit: Payer: Self-pay | Admitting: Family Medicine

## 2017-03-04 NOTE — Telephone Encounter (Signed)
Please phone in refill to [facility].

## 2017-03-04 NOTE — Telephone Encounter (Signed)
RX called in at [place]

## 2017-03-26 ENCOUNTER — Telehealth: Payer: Self-pay | Admitting: Gastroenterology

## 2017-04-02 ENCOUNTER — Encounter: Payer: Self-pay | Admitting: *Deleted

## 2017-04-02 ENCOUNTER — Other Ambulatory Visit: Payer: Self-pay

## 2017-04-02 DIAGNOSIS — Z1211 Encounter for screening for malignant neoplasm of colon: Secondary | ICD-10-CM

## 2017-04-02 NOTE — Telephone Encounter (Signed)
Gastroenterology Pre-Procedure Review  Request Date: 05/06/17 Requesting Physician: Dr. [name]  PATIENT REVIEW QUESTIONS: The patient responded to the following health history questions as indicated:    1. Are you having any GI issues? no 2. Do you have a personal history of Polyps? no 3. Do you have a family history of Colon Cancer or Polyps? no 4. Diabetes Mellitus? no 5. Joint replacements in the past 12 months?no 6. Major health problems in the past 3 months?no 7. Any artificial heart valves, MVP, or defibrillator?no    MEDICATIONS & ALLERGIES:    Patient reports the following regarding taking any anticoagulation/antiplatelet therapy:   Plavix, Coumadin, Eliquis, Xarelto, Lovenox, Pradaxa, Brilinta, or Effient? no Aspirin? no  Patient confirms/reports the following medications:  Current Outpatient Medications  Medication Sig Dispense Refill  . b complex-C-folic acid 1 MG capsule Take 1 capsule by mouth daily.    . Carboxymethylcellulose Sodium (EYE DROPS OP) Apply to eye. Prescribed uses once a week for dry eyes    . ibuprofen (ADVIL) 200 MG tablet Take by mouth.    Marland Kitchen LORazepam (ATIVAN) 0.5 MG tablet TAKE 1 TABLET BY MOUTH TWICE DAILY AS NEEDED 30 tablet 1  . metoprolol succinate (TOPROL-XL) 25 MG 24 hr tablet TAKE 1 TABLET BY MOUTH DAILY 30 tablet 12  . naproxen sodium (ANAPROX) 220 MG tablet Take 220 mg by mouth 2 (two) times daily with a meal.    . PARoxetine (PAXIL) 20 MG tablet TAKE 1 TABLET BY MOUTH DAILY 30 tablet 12  . sildenafil (REVATIO) 20 MG tablet 2-4 tablets by mouth 1-4 hours prior to intercourse - limit dosing to once in 24 hours. 30 tablet 0   No current facility-administered medications for this visit.     Patient confirms/reports the following allergies:  Allergies  Allergen Reactions  . Sulfadimethoxine Nausea Only    flu like symptoms    No orders of the defined types were placed in this encounter.   AUTHORIZATION INFORMATION Primary  Insurance: 1D#: Group #:  Secondary Insurance: 1D#: Group #:  SCHEDULE INFORMATION: Date: 05/06/17 Time: Location:[facility]

## 2017-04-22 ENCOUNTER — Other Ambulatory Visit: Payer: Self-pay | Admitting: Family Medicine

## 2017-04-22 ENCOUNTER — Telehealth: Payer: Self-pay | Admitting: Gastroenterology

## 2017-04-22 MED ORDER — LORAZEPAM 0.5 MG PO TABS: 0.5000 mg | ORAL_TABLET | Freq: Two times a day (BID) | ORAL | 1 refills | Status: DC | PRN

## 2017-04-22 NOTE — Telephone Encounter (Signed)
Patient needs to r/s procedure w/ Dr. [name] on 05/06/17 due to no transportation.

## 2017-04-22 NOTE — Telephone Encounter (Signed)
[facility] faxed a refill request for the following medication. Thanks CC  LORazepam (ATIVAN) 0.5 MG tablet

## 2017-04-22 NOTE — Telephone Encounter (Signed)
RX called in at [place]

## 2017-04-22 NOTE — Telephone Encounter (Signed)
Please phone in Lorazepam refill as authorized in medication list.

## 2017-04-23 NOTE — Telephone Encounter (Signed)
Patients colonoscopy date has been moved to March 7th due to transportation.  Shaun Griffith has been informed of change.

## 2017-05-06 ENCOUNTER — Other Ambulatory Visit: Payer: Self-pay

## 2017-05-06 MED ORDER — PEG 3350-KCL-NA BICARB-NACL 420 G PO SOLR: 4000.0000 mL | Freq: Once | ORAL | 0 refills | Status: AC

## 2017-05-13 ENCOUNTER — Ambulatory Visit: Payer: BLUE CROSS/BLUE SHIELD | Admitting: Anesthesiology

## 2017-05-13 ENCOUNTER — Encounter: Payer: Self-pay | Admitting: *Deleted

## 2017-05-13 ENCOUNTER — Ambulatory Visit
Admission: RE | Admit: 2017-05-13 | Discharge: 2017-05-13 | Disposition: A | Discharge status: Home / Self Care | Payer: BLUE CROSS/BLUE SHIELD | Source: Ambulatory Visit | Attending: Gastroenterology | Admitting: Gastroenterology

## 2017-05-13 ENCOUNTER — Other Ambulatory Visit: Payer: Self-pay

## 2017-05-13 ENCOUNTER — Encounter
Admission: RE | Disposition: A | Discharge status: Home / Self Care | Payer: Self-pay | Source: Ambulatory Visit | Attending: Gastroenterology

## 2017-05-13 DIAGNOSIS — F329 Major depressive disorder, single episode, unspecified: Secondary | ICD-10-CM | POA: Insufficient documentation

## 2017-05-13 DIAGNOSIS — I10 Essential (primary) hypertension: Secondary | ICD-10-CM | POA: Insufficient documentation

## 2017-05-13 DIAGNOSIS — Z87891 Personal history of nicotine dependence: Secondary | ICD-10-CM | POA: Insufficient documentation

## 2017-05-13 DIAGNOSIS — D125 Benign neoplasm of sigmoid colon: Secondary | ICD-10-CM | POA: Diagnosis not present

## 2017-05-13 DIAGNOSIS — K5730 Diverticulosis of large intestine without perforation or abscess without bleeding: Secondary | ICD-10-CM | POA: Insufficient documentation

## 2017-05-13 DIAGNOSIS — D122 Benign neoplasm of ascending colon: Secondary | ICD-10-CM | POA: Diagnosis not present

## 2017-05-13 DIAGNOSIS — Z79899 Other long term (current) drug therapy: Secondary | ICD-10-CM | POA: Insufficient documentation

## 2017-05-13 DIAGNOSIS — F419 Anxiety disorder, unspecified: Secondary | ICD-10-CM | POA: Diagnosis not present

## 2017-05-13 DIAGNOSIS — Z1211 Encounter for screening for malignant neoplasm of colon: Secondary | ICD-10-CM | POA: Insufficient documentation

## 2017-05-13 DIAGNOSIS — K635 Polyp of colon: Secondary | ICD-10-CM | POA: Insufficient documentation

## 2017-05-13 HISTORY — PX: COLONOSCOPY WITH PROPOFOL: SHX5780

## 2017-05-13 HISTORY — DX: Anxiety disorder, unspecified: F41.9

## 2017-05-13 HISTORY — DX: Essential (primary) hypertension: I10

## 2017-05-13 HISTORY — DX: Major depressive disorder, single episode, unspecified: F32.9

## 2017-05-13 HISTORY — DX: Depression, unspecified: F32.A

## 2017-05-13 SURGERY — COLONOSCOPY WITH PROPOFOL
Anesthesia: General

## 2017-05-13 MED ORDER — LIDOCAINE HCL (CARDIAC) 20 MG/ML IV SOLN
INTRAVENOUS | Status: DC | PRN
  Administered 2017-05-13: 50 mg via INTRAVENOUS

## 2017-05-13 MED ORDER — PROPOFOL 10 MG/ML IV BOLUS
INTRAVENOUS | Status: DC | PRN
  Administered 2017-05-13: 25 mg via INTRAVENOUS
  Administered 2017-05-13: 100 mg via INTRAVENOUS

## 2017-05-13 MED ORDER — PROPOFOL 500 MG/50ML IV EMUL
INTRAVENOUS | Status: DC | PRN
  Administered 2017-05-13: 140 ug/kg/min via INTRAVENOUS

## 2017-05-13 MED ORDER — PHENYLEPHRINE HCL 10 MG/ML IJ SOLN
INTRAMUSCULAR | Status: DC | PRN
  Administered 2017-05-13 (×5): 200 ug via INTRAVENOUS

## 2017-05-13 MED ORDER — SODIUM CHLORIDE 0.9 % IV SOLN
INTRAVENOUS | Status: DC
  Administered 2017-05-13: 07:00:00 via INTRAVENOUS

## 2017-05-13 MED ORDER — LIDOCAINE HCL (PF) 2 % IJ SOLN
INTRAMUSCULAR | Status: AC
  Filled 2017-05-13: qty 10

## 2017-05-13 MED ORDER — PROPOFOL 500 MG/50ML IV EMUL
INTRAVENOUS | Status: AC
  Filled 2017-05-13: qty 50

## 2017-05-13 NOTE — Op Note (Signed)
[facility] Patient Name: Shaun Griffith Procedure Date: 05/13/2017 7:37 AM MRN: [id number] Account #: [id number] Date of Birth: March 12, 1960 Admit Type: Outpatient Age: 57 Room: [facility] ENDO ROOM 3 Gender: Male Note Status: Finalized Procedure:            Colonoscopy Indications:          Screening for colorectal malignant neoplasm Providers:            Jonathon Bellows MD, MD Referring MD:         Vickki Muff. Chrismon, MD (Referring MD) Medicines:            Monitored Anesthesia Care Complications:        No immediate complications. Procedure:            Pre-Anesthesia Assessment:                       - Prior to the procedure, a History and Physical was                        performed, and patient medications, allergies and                        sensitivities were reviewed. The patient's tolerance of                        previous anesthesia was reviewed.                       - The risks and benefits of the procedure and the                        sedation options and risks were discussed with the                        patient. All questions were answered and informed                        consent was obtained.                       - ASA Grade Assessment: II - A patient with mild                        systemic disease.                       After obtaining informed consent, the colonoscope was                        passed under direct vision. Throughout the procedure,                        the patient's blood pressure, pulse, and oxygen                        saturations were monitored continuously. The                        Colonoscope was introduced through the anus and  advanced to the the cecum, identified by the                        appendiceal orifice, IC valve and transillumination.                        The colonoscopy was performed with ease. The patient                        tolerated the procedure well. The  quality of the bowel                        preparation was good. Findings:      The perianal and digital rectal examinations were normal.      Two sessile polyps were found in the sigmoid colon and ascending colon.       The polyps were 3 to 4 mm in size. These polyps were removed with a cold       biopsy forceps. Resection and retrieval were complete.      The exam was otherwise without abnormality on direct and retroflexion       views.      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 Impression:           - Two 3 to 4 mm polyps in the sigmoid colon and in the                        ascending colon, removed with a cold biopsy forceps.                        Resected and retrieved.                       - The examination was otherwise normal on direct and                        retroflexion views. Recommendation:       - Discharge patient to home (with escort).                       - Resume previous diet.                       - Continue present medications.                       - Await pathology results.                       - Repeat colonoscopy in 5-10 years for surveillance                        based on pathology results. Procedure Code(s):    --- Professional ---                       [phone], Colonoscopy, flexible; with biopsy, single or                        multiple Diagnosis Code(s):    --- Professional ---  Z12.11, Encounter for screening for malignant neoplasm                        of colon                       D12.5, Benign neoplasm of sigmoid colon                       D12.2, Benign neoplasm of ascending colon CPT copyright 2016 American Medical Association. All rights reserved. The codes documented in this report are preliminary and upon coder review may  be revised to meet current compliance requirements. Jonathon Bellows, MD Jonathon Bellows MD, MD 05/13/2017 8:10:14 AM This report has been signed electronically. Number of  Addenda: 0 Note Initiated On: 05/13/2017 7:37 AM Scope Withdrawal Time: 0 hours 14 minutes 31 seconds  Total Procedure Duration: 0 hours 19 minutes 43 seconds       [facility]

## 2017-05-13 NOTE — H&P (Signed)
Jonathon Bellows, MD [address], [place], [place], [place], [address] [address], [place], [place], [place], [address] Phone: [phone]  Fax: [phone]  Primary Care Physician:  Margo Common, PA   Pre-Procedure History & Physical: HPI:  Shaun Griffith is a 57 y.o. male is here for an colonoscopy.   Past Medical History:  Diagnosis Date  . Anxiety   . Depression   . Hypertension     Past Surgical History:  Procedure Laterality Date  . BACK SURGERY  1990   HNP L5  . COLONOSCOPY    . HERNIA REPAIR  1984   right groin  . KNEE SURGERY Left 1992    Prior to Admission medications   Medication Sig Start Date End Date Taking? Authorizing Provider  Carboxymethylcellulose Sodium (EYE DROPS OP) Apply to eye. Prescribed uses once a week for dry eyes   Yes [provider]  ibuprofen (ADVIL) 200 MG tablet Take by mouth.   Yes [provider]  LORazepam (ATIVAN) 0.5 MG tablet Take 1 tablet (0.5 mg total) by mouth 2 (two) times daily as needed. 04/22/17  Yes Chrismon, Vickki Muff, PA  naproxen sodium (ANAPROX) 220 MG tablet Take 220 mg by mouth 2 (two) times daily with a meal.   Yes [provider]  PARoxetine (PAXIL) 20 MG tablet TAKE 1 TABLET BY MOUTH DAILY 01/15/17  Yes Chrismon, Vickki Muff, PA  sildenafil (REVATIO) 20 MG tablet 2-4 tablets by mouth 1-4 hours prior to intercourse - limit dosing to once in 24 hours. 12/31/16  Yes Chrismon, Vickki Muff, PA  b complex-C-folic acid 1 MG capsule Take 1 capsule by mouth daily.    [provider]  metoprolol succinate (TOPROL-XL) 25 MG 24 hr tablet TAKE 1 TABLET BY MOUTH DAILY 01/15/17   Chrismon, Vickki Muff, PA    Allergies as of 04/02/2017 - Review Complete 12/31/2016  Allergen Reaction Noted  . Sulfadimethoxine Nausea Only 10/22/2014    Family History  Adopted: Yes  Problem Relation Age of Onset  . Diabetes Mother   . Depression Mother   . Cancer Father        skin and blood    Social  History   Socioeconomic History  . Marital status: Divorced    Spouse name: Not on file  . Number of children: Not on file  . Years of education: Not on file  . Highest education level: Not on file  Social Needs  . Financial resource strain: Not on file  . Food insecurity - worry: Not on file  . Food insecurity - inability: Not on file  . Transportation needs - medical: Not on file  . Transportation needs - non-medical: Not on file  Occupational History  . Not on file  Tobacco Use  . Smoking status: Former Smoker    Types: Cigarettes  . Smokeless tobacco: Never Used  . Tobacco comment: quit 2017  Substance and Sexual Activity  . Alcohol use: Yes    Alcohol/week: 7.2 oz    Types: 12 Cans of beer per week    Comment: Moderate alcohol use, drinks beer 3-4 days a week; drinks 8-10 beers  . Drug use: No  . Sexual activity: Yes  Other Topics Concern  . Not on file  Social History Narrative  . Not on file    Review of Systems: See HPI, otherwise negative ROS  Physical Exam: BP 118/81   Pulse 86   Temp (!) 96.9 F (  36.1 C) (Tympanic)   Resp 20   Ht 6\' 3"  (1.905 m)   Wt 275 lb (124.7 kg)   SpO2 100%   BMI 34.37 kg/m  General:   Alert,  pleasant and cooperative in NAD Head:  Normocephalic and atraumatic. Neck:  Supple; no masses or thyromegaly. Lungs:  Clear throughout to auscultation, normal respiratory effort.    Heart:  +S1, +S2, Regular rate and rhythm, No edema. Abdomen:  Soft, nontender and nondistended. Normal bowel sounds, without guarding, and without rebound.   Neurologic:  Alert and  oriented x4;  grossly normal neurologically.  Impression/Plan: Shaun Griffith is here for an colonoscopy to be performed for Screening colonoscopy average risk   Risks, benefits, limitations, and alternatives regarding  colonoscopy have been reviewed with the patient.  Questions have been answered.  All parties agreeable.   Jonathon Bellows, MD  05/13/2017, 7:35 AM

## 2017-05-13 NOTE — Anesthesia Postprocedure Evaluation (Signed)
Anesthesia Post Note  Patient: Shaun Griffith  Procedure(s) Performed: COLONOSCOPY WITH PROPOFOL (N/A )  Patient location during evaluation: Endoscopy Anesthesia Type: General Level of consciousness: awake and alert and oriented Pain management: pain level controlled Vital Signs Assessment: post-procedure vital signs reviewed and stable Respiratory status: spontaneous breathing, nonlabored ventilation and respiratory function stable Cardiovascular status: blood pressure returned to baseline and stable Postop Assessment: no signs of nausea or vomiting Anesthetic complications: no     Last Vitals:  Vitals:   05/13/17 0832 05/13/17 0842  BP: 119/79   Pulse: 72 70  Resp: 16 15  Temp:    SpO2: 99% 98%    Last Pain:  Vitals:   05/13/17 0812  TempSrc: Tympanic                 [name]

## 2017-05-13 NOTE — Transfer of Care (Signed)
Immediate Anesthesia Transfer of Care Note  Patient: Shaun Griffith  Procedure(s) Performed: COLONOSCOPY WITH PROPOFOL (N/A )  Patient Location: PACU and Endoscopy Unit  Anesthesia Type:General  Level of Consciousness: drowsy  Airway & Oxygen Therapy: Patient Spontanous Breathing and Patient connected to nasal cannula oxygen  Post-op Assessment: Report given to RN and Post -op Vital signs reviewed and stable  Post vital signs: Reviewed and stable  Last Vitals:  Vitals:   05/13/17 0711 05/13/17 0812  BP: 118/81 109/73  Pulse: 86 77  Resp: 20 17  Temp: (!) 36.1 C 37.5 C  SpO2: 100% 94%    Last Pain:  Vitals:   05/13/17 0812  TempSrc: Tympanic      Patients Stated Pain Goal: 9 (81/27/51 7001)  Complications: No apparent anesthesia complications

## 2017-05-13 NOTE — Anesthesia Post-op Follow-up Note (Signed)
Anesthesia QCDR form completed.        

## 2017-05-13 NOTE — Anesthesia Preprocedure Evaluation (Signed)
Anesthesia Evaluation  Patient identified by MRN, date of birth, ID band Patient awake    Reviewed: Allergy & Precautions, NPO status , Patient's Chart, lab work & pertinent test results  History of Anesthesia Complications Negative for: history of anesthetic complications  Airway Mallampati: III  TM Distance: >3 FB Neck ROM: Full    Dental no notable dental hx.    Pulmonary sleep apnea (only intermittently wears CPAP) and Continuous Positive Airway Pressure Ventilation , neg COPD, Current Smoker (has not smoked for 1 week),    breath sounds clear to auscultation- rhonchi (-) wheezing      Cardiovascular Exercise Tolerance: Good hypertension, Pt. on medications (-) CAD, (-) Past MI, (-) Cardiac Stents and (-) CABG  Rhythm:Regular Rate:Normal - Systolic murmurs and - Diastolic murmurs    Neuro/Psych PSYCHIATRIC DISORDERS Anxiety Depression negative neurological ROS     GI/Hepatic negative GI ROS, Neg liver ROS,   Endo/Other  negative endocrine ROSneg diabetes  Renal/GU negative Renal ROS     Musculoskeletal negative musculoskeletal ROS (+)   Abdominal (+) + obese,   Peds  Hematology negative hematology ROS (+)   Anesthesia Other Findings Past Medical History: No date: Anxiety No date: Depression No date: Hypertension   Reproductive/Obstetrics                             Anesthesia Physical Anesthesia Plan  ASA: II  Anesthesia Plan: General   Post-op Pain Management:    Induction: Intravenous  PONV Risk Score and Plan: 1 and Propofol infusion  Airway Management Planned: Natural Airway  Additional Equipment:   Intra-op Plan:   Post-operative Plan:   Informed Consent: I have reviewed the patients History and Physical, chart, labs and discussed the procedure including the risks, benefits and alternatives for the proposed anesthesia with the patient or authorized representative  who has indicated his/her understanding and acceptance.   Dental advisory given  Plan Discussed with: CRNA and Anesthesiologist  Anesthesia Plan Comments:         Anesthesia Quick Evaluation

## 2017-05-14 ENCOUNTER — Encounter: Payer: Self-pay | Admitting: Gastroenterology

## 2017-05-14 LAB — SURGICAL PATHOLOGY

## 2017-05-16 ENCOUNTER — Encounter: Payer: Self-pay | Admitting: Gastroenterology

## 2017-07-21 ENCOUNTER — Other Ambulatory Visit: Payer: Self-pay | Admitting: Family Medicine

## 2017-08-20 ENCOUNTER — Other Ambulatory Visit: Payer: Self-pay | Admitting: Family Medicine

## 2017-09-23 ENCOUNTER — Other Ambulatory Visit: Payer: Self-pay | Admitting: Family Medicine

## 2017-11-03 ENCOUNTER — Other Ambulatory Visit: Payer: Self-pay | Admitting: Family Medicine

## 2017-11-04 MED ORDER — LORAZEPAM 0.5 MG PO TABS: 0.5000 mg | ORAL_TABLET | Freq: Two times a day (BID) | ORAL | 0 refills | Status: DC | PRN

## 2017-12-29 ENCOUNTER — Other Ambulatory Visit: Payer: Self-pay | Admitting: Family Medicine

## 2017-12-29 NOTE — Telephone Encounter (Signed)
[facility] faxed refill request for the following medications:  LORazepam (ATIVAN) 0.5 MG tablet  Qty: 60  Last dispensed: 09/23/2017  Please advise.

## 2017-12-30 MED ORDER — LORAZEPAM 0.5 MG PO TABS: 0.5000 mg | ORAL_TABLET | Freq: Two times a day (BID) | ORAL | 0 refills | Status: DC | PRN

## 2018-01-20 ENCOUNTER — Encounter: Payer: Self-pay | Admitting: Family Medicine

## 2018-01-20 ENCOUNTER — Other Ambulatory Visit: Payer: Self-pay

## 2018-01-20 ENCOUNTER — Ambulatory Visit: Payer: BLUE CROSS/BLUE SHIELD | Admitting: Family Medicine

## 2018-01-20 VITALS — BP 156/100 | HR 69 | Temp 97.6°F | Ht 75.0 in | Wt 292.2 lb

## 2018-01-20 DIAGNOSIS — Z23 Encounter for immunization: Secondary | ICD-10-CM | POA: Diagnosis not present

## 2018-01-20 DIAGNOSIS — F4001 Agoraphobia with panic disorder: Secondary | ICD-10-CM

## 2018-01-20 DIAGNOSIS — I10 Essential (primary) hypertension: Secondary | ICD-10-CM | POA: Diagnosis not present

## 2018-01-20 DIAGNOSIS — G4733 Obstructive sleep apnea (adult) (pediatric): Secondary | ICD-10-CM

## 2018-01-20 DIAGNOSIS — Z0000 Encounter for general adult medical examination without abnormal findings: Secondary | ICD-10-CM

## 2018-01-20 NOTE — Progress Notes (Signed)
Patient: Shaun Griffith Male    DOB: 19-Jul-1960   57 y.o.   MRN: [id number] Visit Date: 01/20/2018  Today's Provider: Vernie Murders, PA   Chief Complaint  Patient presents with  . Follow-up    medications  . Diarrhea   Subjective:    HPI  Pt reports he is here for a follow up on medications, annual physical and also c/o having intestinal pain/loose bowels.  Pt had a colonoscopy on 05/13/17. Two small polyps were benign and multiple diverticula found by Dr. [name] (GI). Recommended he have repeat colonoscopy in 5 years.     Past Medical History:  Diagnosis Date  . Anxiety   . Depression   . Hypertension    Past Surgical History:  Procedure Laterality Date  . BACK SURGERY  1990   HNP L5  . COLONOSCOPY    . COLONOSCOPY WITH PROPOFOL N/A 05/13/2017   Procedure: COLONOSCOPY WITH PROPOFOL;  Surgeon: Jonathon Bellows, MD;  Location: [facility] ENDOSCOPY;  Service: Gastroenterology;  Laterality: N/A;  . HERNIA REPAIR  1984   right groin  . KNEE SURGERY Left 1992   Family History  Adopted: Yes  Problem Relation Age of Onset  . Diabetes Mother   . Depression Mother   . Cancer Father        skin and blood   Allergies  Allergen Reactions  . Sulfadimethoxine Nausea Only    flu like symptoms    Current Outpatient Medications:  .  Carboxymethylcellulose Sodium (EYE DROPS OP), Apply to eye. Prescribed uses once a week for dry eyes, Disp: , Rfl:  .  ibuprofen (ADVIL) 200 MG tablet, Take by mouth., Disp: , Rfl:  .  LORazepam (ATIVAN) 0.5 MG tablet, Take 1 tablet (0.5 mg total) by mouth 2 (two) times daily as needed., Disp: 60 tablet, Rfl: 0 .  metoprolol succinate (TOPROL-XL) 25 MG 24 hr tablet, TAKE 1 TABLET BY MOUTH DAILY, Disp: 30 tablet, Rfl: 12 .  naproxen sodium (ANAPROX) 220 MG tablet, Take 220 mg by mouth 2 (two) times daily with a meal., Disp: , Rfl:  .  PARoxetine (PAXIL) 20 MG tablet, TAKE 1 TABLET BY MOUTH DAILY, Disp: 30 tablet, Rfl: 12 .  b complex-C-folic acid 1 MG  capsule, Take 1 capsule by mouth daily., Disp: , Rfl:  .  sildenafil (REVATIO) 20 MG tablet, 2-4 tablets by mouth 1-4 hours prior to intercourse - limit dosing to once in 24 hours., Disp: 30 tablet, Rfl: 0  Review of Systems  Constitutional: Negative.   HENT: Negative.   Eyes: Negative.   Respiratory: Negative.   Cardiovascular: Negative.   Gastrointestinal: Positive for diarrhea. Negative for abdominal distention, abdominal pain, anal bleeding, blood in stool, constipation, nausea, rectal pain and vomiting.  Endocrine: Negative.   Genitourinary: Negative.   Musculoskeletal: Negative.   Skin: Negative.   Allergic/Immunologic: Negative.   Neurological: Negative.   Hematological: Negative.   Psychiatric/Behavioral: Negative.    Social History   Tobacco Use  . Smoking status: Former Smoker    Types: Cigarettes  . Smokeless tobacco: Never Used  . Tobacco comment: quit 2017  Substance Use Topics  . Alcohol use: Yes    Alcohol/week: 12.0 standard drinks    Types: 12 Cans of beer per week    Comment: Moderate alcohol use, drinks beer 3-4 days a week; drinks 8-10 beers   Objective:   BP (!) 156/100 (BP Location: Right Arm, Patient Position: Sitting, Cuff Size: Large)  Pulse 69   Temp 97.6 F (36.4 C) (Oral)   Ht 6\' 3"  (1.905 m)   Wt 292 lb 3.2 oz (132.5 kg)   SpO2 96%   BMI 36.52 kg/m  Vitals:   01/20/18 1326  BP: (!) 156/100  Pulse: 69  Temp: 97.6 F (36.4 C)  TempSrc: Oral  SpO2: 96%  Weight: 292 lb 3.2 oz (132.5 kg)  Height: 6\' 3"  (1.905 m)   Physical Exam  Constitutional: He is oriented to person, place, and time. He appears well-developed and well-nourished.  HENT:  Head: Normocephalic and atraumatic.  Right Ear: External ear normal.  Left Ear: External ear normal.  Nose: Nose normal.  Mouth/Throat: Oropharynx is clear and moist.  Eyes: Pupils are equal, round, and reactive to light. Conjunctivae and EOM are normal. Right eye exhibits no discharge.  Neck:  Normal range of motion. Neck supple. No tracheal deviation present. No thyromegaly present.  Cardiovascular: Normal rate, regular rhythm, normal heart sounds and intact distal pulses.  No murmur heard. Pulmonary/Chest: Effort normal and breath sounds normal. No respiratory distress. He has no wheezes. He has no rales. He exhibits no tenderness.  Abdominal: Soft. He exhibits no distension and no mass. There is no tenderness. There is no rebound and no guarding.  Genitourinary: Penis normal.  Musculoskeletal: Normal range of motion. He exhibits no edema or tenderness.  Lumbar stiffness with well healed scar from past surgery for HNP in 1990. No pain or neuropathy/radiculopathy.  Lymphadenopathy:    He has no cervical adenopathy.  Neurological: He is alert and oriented to person, place, and time. He has normal reflexes. He displays normal reflexes. No cranial nerve deficit. He exhibits normal muscle tone. Coordination normal.  Skin: Skin is warm and dry. No rash noted. No erythema.  Psychiatric: His speech is normal. Judgment and thought content normal. His mood appears anxious. He is agitated. Cognition and memory are normal. He expresses no suicidal plans and no homicidal plans.      Assessment & Plan:     1. Annual physical exam General health stable except BP elevation and anxiety level increasing. Immunizations up to date except need for flu shot today. Had colonoscopy by Dr. [name] (GI) in March 2019 that showed some diverticulosis with 2 benign polyps. Was having some loose stools that responded well to a probiotic supplement. Recommend he restart the supplement and get routine labs. - CBC with Differential/Platelet - Comprehensive metabolic panel - Lipid panel - TSH  2. Panic disorder with agoraphobia Feeling anxious and having it worsen to panic level when he is in a crowd or leaves home. Lorazepam 0.5 mg BID prn with Paxil 20 mg qd not controlling symptoms as well as in the past. Will  increase Metoprolol Succinate and recheck CBC, CMP with TSH. Should limit caffeine and energy drinks. - CBC with Differential/Platelet - Comprehensive metabolic panel - TSH  3. Essential hypertension Tolerating Metoprolol Succinate 25 mg qd but BP higher today. Recommend he limit caffeine and sodium intake. Increase Metoprolol Succinate to 50 mg qd and recheck labs. Check BP at home and give report when lab results available. - CBC with Differential/Platelet - Comprehensive metabolic panel - Lipid panel  4. Obstructive apnea Stable and well controlled with CPAP at 10 cm H2O at night. Energy level worse if he forgets to use it. Snoring controlled with regular use.  5. Needs flu shot - Flu Vaccine QUAD 6+ mos PF IM (Fluarix Quad PF)  Vernie Murders, PA  [facility]

## 2018-01-21 ENCOUNTER — Other Ambulatory Visit: Payer: Self-pay | Admitting: Family Medicine

## 2018-01-21 ENCOUNTER — Telehealth: Payer: Self-pay | Admitting: Family Medicine

## 2018-01-21 DIAGNOSIS — F411 Generalized anxiety disorder: Secondary | ICD-10-CM

## 2018-01-21 DIAGNOSIS — I10 Essential (primary) hypertension: Secondary | ICD-10-CM

## 2018-01-21 LAB — LIPID PANEL
CHOLESTEROL TOTAL: 163 mg/dL (ref 100–199)
Chol/HDL Ratio: 2.9 ratio (ref 0.0–5.0)
HDL: 56 mg/dL (ref >39)
LDL CALC: 82 mg/dL (ref 0–99)
Triglycerides: 126 mg/dL (ref 0–149)
VLDL Cholesterol Cal: 25 mg/dL (ref 5–40)

## 2018-01-21 LAB — COMPREHENSIVE METABOLIC PANEL
A/G RATIO: 1.8 (ref 1.2–2.2)
ALBUMIN: 4.4 g/dL (ref 3.5–5.5)
ALK PHOS: 57 IU/L (ref 39–117)
ALT: 22 IU/L (ref 0–44)
AST: 20 IU/L (ref 0–40)
BILIRUBIN TOTAL: 0.6 mg/dL (ref 0.0–1.2)
BUN / CREAT RATIO: 17 (ref 9–20)
BUN: 14 mg/dL (ref 6–24)
CHLORIDE: 101 mmol/L (ref 96–106)
CO2: 21 mmol/L (ref 20–29)
Calcium: 9.2 mg/dL (ref 8.7–10.2)
Creatinine, Ser: 0.81 mg/dL (ref 0.76–1.27)
GFR calc non Af Amer: 98 mL/min/{1.73_m2} (ref >59)
GFR, EST AFRICAN AMERICAN: 113 mL/min/{1.73_m2} (ref >59)
GLOBULIN, TOTAL: 2.4 g/dL (ref 1.5–4.5)
Glucose: 91 mg/dL (ref 65–99)
POTASSIUM: 4.3 mmol/L (ref 3.5–5.2)
SODIUM: 138 mmol/L (ref 134–144)
TOTAL PROTEIN: 6.8 g/dL (ref 6.0–8.5)

## 2018-01-21 LAB — CBC WITH DIFFERENTIAL/PLATELET
BASOS ABS: 0.1 10*3/uL (ref 0.0–0.2)
Basos: 1 %
EOS (ABSOLUTE): 0.2 10*3/uL (ref 0.0–0.4)
Eos: 3 %
Hematocrit: 41.4 % (ref 37.5–51.0)
Hemoglobin: 14.4 g/dL (ref 13.0–17.7)
Immature Grans (Abs): 0 10*3/uL (ref 0.0–0.1)
Immature Granulocytes: 0 %
LYMPHS ABS: 1.6 10*3/uL (ref 0.7–3.1)
Lymphs: 23 %
MCH: 32.6 pg (ref 26.6–33.0)
MCHC: 34.8 g/dL (ref 31.5–35.7)
MCV: 94 fL (ref 79–97)
MONOCYTES: 8 %
MONOS ABS: 0.5 10*3/uL (ref 0.1–0.9)
Neutrophils Absolute: 4.7 10*3/uL (ref 1.4–7.0)
Neutrophils: 65 %
Platelets: 165 10*3/uL (ref 150–450)
RBC: 4.42 x10E6/uL (ref 4.14–5.80)
RDW: 12 % — ABNORMAL LOW (ref 12.3–15.4)
WBC: 7.2 10*3/uL (ref 3.4–10.8)

## 2018-01-21 LAB — TSH: TSH: 1.58 u[IU]/mL (ref 0.450–4.500)

## 2018-01-21 MED ORDER — METOPROLOL SUCCINATE ER 50 MG PO TB24: 50.0000 mg | ORAL_TABLET | Freq: Every day | ORAL | 3 refills | Status: DC

## 2018-01-21 MED ORDER — TRAZODONE HCL 50 MG PO TABS: 25.0000 mg | ORAL_TABLET | Freq: Every evening | ORAL | 3 refills | Status: DC | PRN

## 2018-01-21 NOTE — Progress Notes (Signed)
Will send changes to his pharmacy.

## 2018-01-21 NOTE — Telephone Encounter (Signed)
Pt returned missed call.  Please call pt back if needed.  Thanks, [employer]

## 2018-01-25 ENCOUNTER — Encounter: Payer: Self-pay | Admitting: Family Medicine

## 2018-01-31 ENCOUNTER — Encounter: Payer: Self-pay | Admitting: Family Medicine

## 2018-03-21 ENCOUNTER — Other Ambulatory Visit: Payer: Self-pay | Admitting: Family Medicine

## 2018-03-21 DIAGNOSIS — F4001 Agoraphobia with panic disorder: Secondary | ICD-10-CM

## 2018-05-03 ENCOUNTER — Other Ambulatory Visit: Payer: Self-pay | Admitting: Family Medicine

## 2018-05-03 DIAGNOSIS — F4001 Agoraphobia with panic disorder: Secondary | ICD-10-CM

## 2018-06-08 ENCOUNTER — Other Ambulatory Visit: Payer: Self-pay | Admitting: Family Medicine

## 2018-06-08 DIAGNOSIS — F4001 Agoraphobia with panic disorder: Secondary | ICD-10-CM

## 2018-06-08 NOTE — Telephone Encounter (Signed)
Please review

## 2018-07-05 ENCOUNTER — Other Ambulatory Visit: Payer: Self-pay | Admitting: Family Medicine

## 2018-07-05 DIAGNOSIS — F4001 Agoraphobia with panic disorder: Secondary | ICD-10-CM

## 2018-07-05 DIAGNOSIS — I10 Essential (primary) hypertension: Secondary | ICD-10-CM

## 2018-09-06 ENCOUNTER — Other Ambulatory Visit: Payer: Self-pay | Admitting: Family Medicine

## 2018-09-06 DIAGNOSIS — F4001 Agoraphobia with panic disorder: Secondary | ICD-10-CM

## 2018-10-06 ENCOUNTER — Other Ambulatory Visit: Payer: Self-pay | Admitting: Family Medicine

## 2018-10-06 DIAGNOSIS — F4001 Agoraphobia with panic disorder: Secondary | ICD-10-CM

## 2018-11-07 ENCOUNTER — Other Ambulatory Visit: Payer: Self-pay | Admitting: Family Medicine

## 2018-11-07 ENCOUNTER — Other Ambulatory Visit: Payer: Self-pay

## 2018-11-07 DIAGNOSIS — F4001 Agoraphobia with panic disorder: Secondary | ICD-10-CM

## 2018-11-08 ENCOUNTER — Other Ambulatory Visit: Payer: Self-pay | Admitting: Family Medicine

## 2018-11-08 DIAGNOSIS — F4001 Agoraphobia with panic disorder: Secondary | ICD-10-CM

## 2018-11-08 MED ORDER — LORAZEPAM 0.5 MG PO TABS: 0.5000 mg | ORAL_TABLET | Freq: Two times a day (BID) | ORAL | 0 refills | Status: DC | PRN

## 2018-12-06 ENCOUNTER — Other Ambulatory Visit: Payer: Self-pay | Admitting: Family Medicine

## 2018-12-06 DIAGNOSIS — F4001 Agoraphobia with panic disorder: Secondary | ICD-10-CM

## 2019-01-04 ENCOUNTER — Other Ambulatory Visit: Payer: Self-pay | Admitting: Family Medicine

## 2019-01-04 DIAGNOSIS — I10 Essential (primary) hypertension: Secondary | ICD-10-CM

## 2019-01-04 DIAGNOSIS — F4001 Agoraphobia with panic disorder: Secondary | ICD-10-CM

## 2019-02-06 ENCOUNTER — Other Ambulatory Visit: Payer: Self-pay | Admitting: Family Medicine

## 2019-02-06 ENCOUNTER — Encounter: Payer: Self-pay | Admitting: Family Medicine

## 2019-02-06 DIAGNOSIS — F4001 Agoraphobia with panic disorder: Secondary | ICD-10-CM

## 2019-03-16 ENCOUNTER — Encounter: Payer: BLUE CROSS/BLUE SHIELD | Admitting: Family Medicine

## 2019-04-04 ENCOUNTER — Encounter: Payer: BLUE CROSS/BLUE SHIELD | Admitting: Family Medicine

## 2019-04-06 ENCOUNTER — Other Ambulatory Visit: Payer: Self-pay | Admitting: Family Medicine

## 2019-04-06 DIAGNOSIS — F4001 Agoraphobia with panic disorder: Secondary | ICD-10-CM

## 2019-04-06 NOTE — Telephone Encounter (Signed)
Requested medication (s) are due for refill today: yes  Requested medication (s) are on the active medication list: yes  Last refill: 03/07/2019  Future visit scheduled: no  Notes to clinic:  This refill cannot be delegated    Requested Prescriptions  Pending Prescriptions Disp Refills   LORazepam (ATIVAN) 0.5 MG tablet [Pharmacy Med Name: LORAZEPAM 0.5 MG TAB] 60 tablet     Sig: TAKE 1 TABLET BY MOUTH TWICE DAILY AS NEEDED      Not Delegated - Psychiatry:  Anxiolytics/Hypnotics Failed - 04/06/2019  8:46 AM      Failed - This refill cannot be delegated      Failed - Urine Drug Screen completed in last 360 days.      Failed - Valid encounter within last 6 months    Recent Outpatient Visits           1 year ago Annual physical exam   [place], [place]   2 years ago Annual physical exam   [facility], [place], [place]   3 years ago Annual physical exam   [place], [place]   3 years ago Anxiety disorder, unspecified anxiety disorder type   [place], [place]   3 years ago Dizziness and giddiness   [facility], [place], [place]

## 2019-04-19 NOTE — Progress Notes (Signed)
Patient: Shaun Griffith, Male    DOB: Aug 07, 1960, 59 y.o.   MRN: [id number] Visit Date: 04/20/2019  Today's Provider: Vernie Murders, PA   Chief Complaint  Patient presents with  . Annual Exam   Subjective:  Shaun Griffith is a 59 y.o. male who presents today for health maintenance and complete physical. He feels fairly well. He reports no exercising. He reports he is sleeping poorly.   Review of Systems  Constitutional: Negative.  Negative for appetite change, chills, fatigue and fever.  HENT: Negative.  Negative for congestion, ear pain, hearing loss, nosebleeds and trouble swallowing.   Eyes: Positive for redness. Negative for pain and visual disturbance.  Respiratory: Positive for apnea. Negative for cough, chest tightness and shortness of breath.   Cardiovascular: Negative.  Negative for chest pain, palpitations and leg swelling.  Gastrointestinal: Negative.  Negative for abdominal pain, blood in stool, constipation, diarrhea, nausea and vomiting.  Endocrine: Negative.  Negative for polydipsia, polyphagia and polyuria.  Genitourinary: Negative.  Negative for dysuria and flank pain.  Musculoskeletal: Positive for arthralgias and back pain. Negative for joint swelling, myalgias and neck stiffness.  Skin: Negative.  Negative for color change, rash and wound.  Allergic/Immunologic: Negative.   Neurological: Negative.  Negative for dizziness, tremors, seizures, speech difficulty, weakness, light-headedness and headaches.  Hematological: Negative.   Psychiatric/Behavioral: Positive for sleep disturbance. Negative for behavioral problems, confusion, decreased concentration and dysphoric mood. The patient is nervous/anxious.   All other systems reviewed and are negative.   Social History   Socioeconomic History  . Marital status: Divorced    Spouse name: Not on file  . Number of children: Not on file  . Years of education: Not on file  . Highest education level: Not on file   Occupational History  . Not on file  Tobacco Use  . Smoking status: Current Some Day Smoker    Types: Cigarettes  . Smokeless tobacco: Never Used  . Tobacco comment: 1 pack per week  Substance and Sexual Activity  . Alcohol use: Yes    Alcohol/week: 12.0 standard drinks    Types: 12 Cans of beer per week    Comment: Moderate alcohol use, drinks beer 3-4 days a week; drinks 8-10 beers  . Drug use: No  . Sexual activity: Yes  Other Topics Concern  . Not on file  Social History Narrative  . Not on file   Social Determinants of Health   Financial Resource Strain:   . Difficulty of Paying Living Expenses: Not on file  Food Insecurity:   . Worried About Charity fundraiser in the Last Year: Not on file  . Ran Out of Food in the Last Year: Not on file  Transportation Needs:   . Lack of Transportation (Medical): Not on file  . Lack of Transportation (Non-Medical): Not on file  Physical Activity:   . Days of Exercise per Week: Not on file  . Minutes of Exercise per Session: Not on file  Stress:   . Feeling of Stress : Not on file  Social Connections:   . Frequency of Communication with Friends and Family: Not on file  . Frequency of Social Gatherings with Friends and Family: Not on file  . Attends Religious Services: Not on file  . Active Member of Clubs or Organizations: Not on file  . Attends Archivist Meetings: Not on file  . Marital Status: Not on file  Intimate Partner Violence:   . Fear of Current  or Ex-Partner: Not on file  . Emotionally Abused: Not on file  . Physically Abused: Not on file  . Sexually Abused: Not on file    Patient Active Problem List   Diagnosis Date Noted  . Hypertension 07/01/2015  . Anxiety disorder 02/18/2015  . Borderline diabetes 10/22/2014  . Chest discomfort 10/22/2014  . Groin pain 10/22/2014  . Excessive urination at night 10/22/2014  . Obstructive apnea 10/22/2014  . Snores 10/22/2014  . 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 BACK SURGERY  1990   HNP L5  . COLONOSCOPY    . COLONOSCOPY WITH PROPOFOL N/A 05/13/2017   Procedure: COLONOSCOPY WITH PROPOFOL;  Surgeon: Jonathon Bellows, MD;  Location: [facility] ENDOSCOPY;  Service: Gastroenterology;  Laterality: N/A;  . HERNIA REPAIR  1984   right groin  . KNEE SURGERY Left 1992    His family history includes Cancer in his father; Depression in his mother; Diabetes in his mother. He was adopted.     Outpatient Encounter Medications as of 04/20/2019  Medication Sig  . b complex-C-folic acid 1 MG capsule Take 1 capsule by mouth daily.  Marland Kitchen LORazepam (ATIVAN) 0.5 MG tablet Take 1 tablet (0.5 mg total) by mouth 2 (two) times daily as needed. Please schedule office visit before any future refills.  . metoprolol succinate (TOPROL-XL) 50 MG 24 hr tablet TAKE ONE TABLET (50 MG) BY MOUTH EVERY DAY  . traZODone (DESYREL) 50 MG tablet Take 0.5-1 tablets (25-50 mg total) by mouth at bedtime as needed for sleep. (Patient not taking: Reported on 04/20/2019)  . [DISCONTINUED] Carboxymethylcellulose Sodium (EYE DROPS OP) Apply to eye. Prescribed uses once a week for dry eyes   No facility-administered encounter medications on file as of 04/20/2019.    Patient Care Team: Margo Common, [place] as PCP - General (Physician Assistant)      Objective:   Vitals:  Vitals:   04/20/19 0915 04/20/19 0917  BP: (!) 148/90 (!) 146/90  Pulse: 83   Resp: 16   Temp: (!) 97.5 F (36.4 C)   TempSrc: Temporal   SpO2: 97%   Weight: 297 lb (134.7 kg)   Height: 6\' 3"  (1.905 m)   Body mass index is 37.12 kg/m.  Physical Exam Constitutional:      Appearance: He is well-developed.  HENT:     Head: Normocephalic and atraumatic.     Right Ear: External ear normal.     Left Ear: External ear normal.     Nose: Nose normal.  Eyes:     General:        Right eye: No discharge.     Conjunctiva/sclera: Conjunctivae normal.      Pupils: Pupils are equal, round, and reactive to light.  Neck:     Thyroid: No thyromegaly.     Trachea: No tracheal deviation.  Cardiovascular:     Rate and Rhythm: Normal rate and regular rhythm.     Heart sounds: Normal heart sounds. No murmur.  Pulmonary:     Effort: Pulmonary effort is normal. No respiratory distress.     Breath sounds: Normal breath sounds. No wheezing or rales.  Chest:     Chest wall: No tenderness.  Abdominal:     General: There is no distension.     Palpations: Abdomen is soft. There is no mass.     Tenderness: There is no abdominal tenderness. There is no guarding or rebound.  Genitourinary:    Penis: Normal.      Testes: Normal.     Prostate: Normal.     Rectum: Normal. Guaiac result negative.  Musculoskeletal:        General: No tenderness. Normal range of motion.     Cervical back: Normal range of motion and neck supple.  Lymphadenopathy:     Cervical: No cervical adenopathy.  Skin:    General: Skin is warm and dry.     Findings: No erythema or rash.  Neurological:     Mental Status: He is alert and oriented to person, place, and time.     Cranial Nerves: No cranial nerve deficit.     Motor: No abnormal muscle tone.     Coordination: Coordination normal.     Deep Tendon Reflexes: Reflexes are normal and symmetric. Reflexes normal.  Psychiatric:        Attention and Perception: Attention normal.        Mood and Affect: Mood is anxious and depressed.        Speech: Speech normal.        Behavior: Behavior normal. Behavior is cooperative.        Thought Content: Thought content normal.        Cognition and Memory: Cognition normal.        Judgment: Judgment normal.    Depression Screen PHQ 2/9 Scores 04/20/2019 01/20/2018 12/31/2016 12/31/2015  PHQ - 2 Score 3 0 2 2  PHQ- 9 Score 8 - 6 3    Assessment & Plan:     Routine Health Maintenance and Physical Exam  Exercise Activities and Dietary recommendations Goals   Sedentary.  Recommend walking for 30-40 minutes 3-4 days a week for exercise and lower calorie diet to help reduce weight.     Immunization History  Administered Date(s) Administered  . Influenza,inj,Quad PF,6+ Mos 12/31/2015, 01/20/2018  . Influenza-Unspecified 01/01/2015  . Tdap 12/31/2015    Health Maintenance  Topic Date Due  . INFLUENZA VACCINE  10/08/2018  . COLONOSCOPY  05/14/2022  . TETANUS/TDAP  12/30/2025  . Hepatitis C Screening  Completed  . HIV Screening  Completed     Discussed health benefits of physical activity, and encouraged him to engage in regular exercise appropriate for his age and condition.  1. Annual physical exam General health stable with increase in weight. Must work on diet and weight loss. Declines flu vaccination today. Had colonoscopy by Dr. [name] (GI) in March 2019 that showed some diverticulosis with 2 benign polyps. Recheck routine labs and follow up pending reports. - CBC with Differential/Platelet - Comprehensive metabolic panel - Lipid Panel With LDL/HDL Ratio - TSH - PSA  2. Panic disorder with agoraphobia Feels panic is better controlled and ready to decrease Lorazepam to prn instead of BID daily. Will add Trazodone to help with sleep and maintain anxiety control. Encouraged to exercise regularly. - CBC with Differential/Platelet - Comprehensive metabolic panel - TSH - traZODone (DESYREL) 50 MG tablet; Take 0.5-1 tablets (25-50 mg total) by mouth at bedtime as needed for sleep.  Dispense: 30 tablet; Refill: 3  3. Essential hypertension BP borderline today. Tolerating the Metoprolol Succinate 50 mg qd without side effects. No chest pains, palpitations or dyspnea. BMI over 37 and needs to lose weight. Check routine labs. Wt Readings from Last 3 Encounters:  04/20/19 297 lb (134.7 kg)  01/20/18 292 lb 3.2 oz (132.5 kg)  05/13/17 275 lb (124.7 kg)    - CBC with Differential/Platelet -  Comprehensive metabolic panel - Lipid Panel With LDL/HDL  Ratio - TSH  4. Obstructive apnea Tolerating CPAP at 10 cm H20 pressure each night. Lives alone, so, no witnessed snoring. - CBC with Differential/Platelet  5. Sleep disturbance Go to bed at 11 pm and usually get up at 8 am. Waking several times a night without nocturia or restless legs. Using CPAP regularly without causing more sleep disturbance. Will try back on the Trazodone. States he feels a little "depressed" but no suicidal ideation. - traZODone (DESYREL) 50 MG tablet; Take 0.5-1 tablets (25-50 mg total) by mouth at bedtime as needed for sleep.  Dispense: 30 tablet; Refill: 3  6. Screening PSA (prostate specific antigen) - PSA  7. Tobacco use Down to 1 pack per week. Encouraged to totally stop all smoking or vaping.  8. Needs flu shot Declined.

## 2019-04-20 ENCOUNTER — Other Ambulatory Visit: Payer: Self-pay

## 2019-04-20 ENCOUNTER — Ambulatory Visit (INDEPENDENT_AMBULATORY_CARE_PROVIDER_SITE_OTHER): Payer: 59 | Admitting: Family Medicine

## 2019-04-20 ENCOUNTER — Encounter: Payer: BLUE CROSS/BLUE SHIELD | Admitting: Family Medicine

## 2019-04-20 ENCOUNTER — Encounter: Payer: Self-pay | Admitting: Family Medicine

## 2019-04-20 VITALS — BP 146/90 | HR 83 | Temp 97.5°F | Resp 16 | Ht 75.0 in | Wt 297.0 lb

## 2019-04-20 DIAGNOSIS — Z125 Encounter for screening for malignant neoplasm of prostate: Secondary | ICD-10-CM

## 2019-04-20 DIAGNOSIS — I10 Essential (primary) hypertension: Secondary | ICD-10-CM | POA: Diagnosis not present

## 2019-04-20 DIAGNOSIS — G4733 Obstructive sleep apnea (adult) (pediatric): Secondary | ICD-10-CM

## 2019-04-20 DIAGNOSIS — Z720 Tobacco use: Secondary | ICD-10-CM

## 2019-04-20 DIAGNOSIS — F4001 Agoraphobia with panic disorder: Secondary | ICD-10-CM | POA: Diagnosis not present

## 2019-04-20 DIAGNOSIS — Z0000 Encounter for general adult medical examination without abnormal findings: Secondary | ICD-10-CM | POA: Diagnosis not present

## 2019-04-20 DIAGNOSIS — G479 Sleep disorder, unspecified: Secondary | ICD-10-CM

## 2019-04-20 DIAGNOSIS — Z23 Encounter for immunization: Secondary | ICD-10-CM

## 2019-04-20 MED ORDER — TRAZODONE HCL 50 MG PO TABS: 25.0000 mg | ORAL_TABLET | Freq: Every evening | ORAL | 3 refills | Status: DC | PRN

## 2019-04-21 ENCOUNTER — Telehealth: Payer: Self-pay | Admitting: Family Medicine

## 2019-04-21 ENCOUNTER — Telehealth: Payer: Self-pay

## 2019-04-21 LAB — CBC WITH DIFFERENTIAL/PLATELET
Basophils Absolute: 0.1 10*3/uL (ref 0.0–0.2)
Basos: 1 %
EOS (ABSOLUTE): 0.4 10*3/uL (ref 0.0–0.4)
Eos: 5 %
Hematocrit: 43.1 % (ref 37.5–51.0)
Hemoglobin: 14.9 g/dL (ref 13.0–17.7)
Immature Grans (Abs): 0 10*3/uL (ref 0.0–0.1)
Immature Granulocytes: 0 %
Lymphocytes Absolute: 2.3 10*3/uL (ref 0.7–3.1)
Lymphs: 27 %
MCH: 32.5 pg (ref 26.6–33.0)
MCHC: 34.6 g/dL (ref 31.5–35.7)
MCV: 94 fL (ref 79–97)
Monocytes Absolute: 0.7 10*3/uL (ref 0.1–0.9)
Monocytes: 8 %
Neutrophils Absolute: 5 10*3/uL (ref 1.4–7.0)
Neutrophils: 59 %
Platelets: 148 10*3/uL — ABNORMAL LOW (ref 150–450)
RBC: 4.59 x10E6/uL (ref 4.14–5.80)
RDW: 11.8 % (ref 11.6–15.4)
WBC: 8.5 10*3/uL (ref 3.4–10.8)

## 2019-04-21 LAB — COMPREHENSIVE METABOLIC PANEL
ALT: 26 IU/L (ref 0–44)
AST: 22 IU/L (ref 0–40)
Albumin/Globulin Ratio: 1.6 (ref 1.2–2.2)
Albumin: 4.2 g/dL (ref 3.8–4.9)
Alkaline Phosphatase: 54 IU/L (ref 39–117)
BUN/Creatinine Ratio: 18 (ref 9–20)
BUN: 13 mg/dL (ref 6–24)
Bilirubin Total: 0.6 mg/dL (ref 0.0–1.2)
CO2: 23 mmol/L (ref 20–29)
Calcium: 9 mg/dL (ref 8.7–10.2)
Chloride: 105 mmol/L (ref 96–106)
Creatinine, Ser: 0.73 mg/dL — ABNORMAL LOW (ref 0.76–1.27)
GFR calc Af Amer: 117 mL/min/{1.73_m2} (ref >59)
GFR calc non Af Amer: 102 mL/min/{1.73_m2} (ref >59)
Globulin, Total: 2.6 g/dL (ref 1.5–4.5)
Glucose: 87 mg/dL (ref 65–99)
Potassium: 4.1 mmol/L (ref 3.5–5.2)
Sodium: 139 mmol/L (ref 134–144)
Total Protein: 6.8 g/dL (ref 6.0–8.5)

## 2019-04-21 LAB — TSH: TSH: 2.49 u[IU]/mL (ref 0.450–4.500)

## 2019-04-21 LAB — LIPID PANEL WITH LDL/HDL RATIO
Cholesterol, Total: 164 mg/dL (ref 100–199)
HDL: 51 mg/dL (ref >39)
LDL Chol Calc (NIH): 88 mg/dL (ref 0–99)
LDL/HDL Ratio: 1.7 ratio (ref 0.0–3.6)
Triglycerides: 143 mg/dL (ref 0–149)
VLDL Cholesterol Cal: 25 mg/dL (ref 5–40)

## 2019-04-21 LAB — PSA: Prostate Specific Ag, Serum: 0.5 ng/mL (ref 0.0–4.0)

## 2019-04-21 NOTE — Telephone Encounter (Signed)
Attempted to call patient to review lab results and recommendations from PCP. Left message to return call.

## 2019-04-21 NOTE — Telephone Encounter (Signed)
Pt given lab results per notes of Vernie Murders PA. on 04/21/19. Pt verbalized understanding.

## 2019-04-21 NOTE — Telephone Encounter (Signed)
LMTCB, PEC may give results. 

## 2019-04-21 NOTE — Telephone Encounter (Signed)
-----   Message from Margo Common, [place] sent at 04/21/2019 12:02 PM EST ----- Blood tests essentially normal with minor variations. Continue present medications and recheck annually.

## 2019-05-15 ENCOUNTER — Telehealth (INDEPENDENT_AMBULATORY_CARE_PROVIDER_SITE_OTHER): Payer: 59 | Admitting: Family Medicine

## 2019-05-15 ENCOUNTER — Encounter: Payer: Self-pay | Admitting: Family Medicine

## 2019-05-15 DIAGNOSIS — H6982 Other specified disorders of Eustachian tube, left ear: Secondary | ICD-10-CM | POA: Diagnosis not present

## 2019-05-15 NOTE — Progress Notes (Signed)
Shaun Griffith  MRN: [id number] DOB: 1960-12-06  Subjective:  HPI   The patient is a 59 year old male who presents via video for evaluation of possible sinus infection.  He states he has had the symptoms for about 3 weeks.  He feels a lot of pressure from head congestion.  He states his ears are painful.  Virtual Visit via Video Note  I connected with Shaun Griffith on 05/15/19 at  1:20 PM EST by a video enabled telemedicine application and verified that I am speaking with the correct person using two identifiers.  Location: Patient: home Provider: office   I discussed the limitations of evaluation and management by telemedicine and the availability of in person appointments. The patient expressed understanding and agreed to proceed.  Patient Active Problem List   Diagnosis Date Noted  . Hypertension 07/01/2015  . Anxiety disorder 02/18/2015  . Borderline diabetes 10/22/2014  . Chest discomfort 10/22/2014  . Groin pain 10/22/2014  . Excessive urination at night 10/22/2014  . Obstructive apnea 10/22/2014  . Snores 10/22/2014  . 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 Anxiety   . Depression   . Hypertension     Social History   Socioeconomic History  . Marital status: Divorced    Spouse name: Not on file  . Number of children: Not on file  . Years of education: Not on file  . Highest education level: Not on file  Occupational History  . Not on file  Tobacco Use  . Smoking status: Current Some Day Smoker    Types: Cigarettes  . Smokeless tobacco: Never Used  . Tobacco comment: 1 pack per week  Substance and Sexual Activity  . Alcohol use: Yes    Alcohol/week: 12.0 standard drinks    Types: 12 Cans of beer per week    Comment: Moderate alcohol use, drinks beer 3-4 days a week; drinks 8-10 beers  . Drug use: No  . Sexual activity: Yes  Other Topics Concern  . Not on file  Social History Narrative    . Not on file   Social Determinants of Health   Financial Resource Strain:   . Difficulty of Paying Living Expenses: Not on file  Food Insecurity:   . Worried About Charity fundraiser in the Last Year: Not on file  . Ran Out of Food in the Last Year: Not on file  Transportation Needs:   . Lack of Transportation (Medical): Not on file  . Lack of Transportation (Non-Medical): Not on file  Physical Activity:   . Days of Exercise per Week: Not on file  . Minutes of Exercise per Session: Not on file  Stress:   . Feeling of Stress : Not on file  Social Connections:   . Frequency of Communication with Friends and Family: Not on file  . Frequency of Social Gatherings with Friends and Family: Not on file  . Attends Religious Services: Not on file  . Active Member of Clubs or Organizations: Not on file  . Attends Archivist Meetings: Not on file  . Marital Status: Not on file  Intimate Partner Violence:   . Fear of Current or Ex-Partner: Not on file  . Emotionally Abused: Not on file  . Physically Abused: Not on file  . Sexually Abused: Not on file    Outpatient Encounter Medications as of 05/15/2019  Medication Sig  . b complex-C-folic  acid 1 MG capsule Take 1 capsule by mouth daily.  Marland Kitchen LORazepam (ATIVAN) 0.5 MG tablet Take 1 tablet (0.5 mg total) by mouth 2 (two) times daily as needed. Please schedule office visit before any future refills.  . metoprolol succinate (TOPROL-XL) 50 MG 24 hr tablet TAKE ONE TABLET (50 MG) BY MOUTH EVERY DAY  . traZODone (DESYREL) 50 MG tablet Take 0.5-1 tablets (25-50 mg total) by mouth at bedtime as needed for sleep.   No facility-administered encounter medications on file as of 05/15/2019.    Allergies  Allergen Reactions  . Sulfadimethoxine Nausea Only    flu like symptoms    Review of Systems  Constitutional: Negative for fever.  HENT: Positive for congestion and ear pain (pressure and fullness). Negative for sinus pain and sore  throat.   Respiratory: Negative for cough and shortness of breath.   Neurological: Negative for headaches.    Objective:  There were no vitals taken for this visit.  WDWN male in no apparent distress.  Head: Normocephalic, atraumatic. Neck: Supple, NROM Respiratory: No apparent distress Psych: Normal mood and affect   Assessment and Plan :   1. Dysfunction of left eustachian tube Having pressure in the left ear the past 3 weeks with muffled hearing, ringing and sensation of "fluid in the ear". Slight PND in throat with sneezing and stopped up nose. No cough, fever, loss of taste, dizziness or headache. Tried Sudafed with a Netti Pot irrigation without relief. No exposure to COVID infection or cough. May use Claritin or Zyrtec with Flonase Nasal spray. If no better in 5 days, add Mucinex-D and call report of progress. Given COVID-19 pandemic precautions.  I discussed the assessment and treatment plan with the patient. The patient was provided an opportunity to ask questions and all were answered. The patient agreed with the plan and demonstrated an understanding of the instructions.   The patient was advised to call back or seek an in-person evaluation if the symptoms worsen or if the condition fails to improve as anticipated.  I provided 12 minutes of non-face-to-face time during this encounter.

## 2019-06-06 ENCOUNTER — Other Ambulatory Visit: Payer: Self-pay | Admitting: Family Medicine

## 2019-06-06 DIAGNOSIS — F4001 Agoraphobia with panic disorder: Secondary | ICD-10-CM

## 2019-06-06 NOTE — Telephone Encounter (Signed)
Requested medication (s) are due for refill today: yes  Requested medication (s) are on the active medication list: yes  Last refill:  03/11/19  Future visit scheduled: No  Notes to clinic:  Medication not delegated to NT to refill- Pt had CPE 04/20/19   Requested Prescriptions  Pending Prescriptions Disp Refills   LORazepam (ATIVAN) 0.5 MG tablet [Pharmacy Med Name: LORAZEPAM 0.5 MG TAB] 60 tablet     Sig: TAKE 1 TABLET BY MOUTH TWICE DAILY AS NEEDED      Not Delegated - Psychiatry:  Anxiolytics/Hypnotics Failed - 06/06/2019 10:24 AM      Failed - This refill cannot be delegated      Failed - Urine Drug Screen completed in last 360 days.      Passed - Valid encounter within last 6 months    Recent Outpatient Visits           3 weeks ago Dysfunction of left eustachian tube   [place], Vickki Muff, [place]   1 month ago Annual physical exam   [place], [place]   1 year ago Annual physical exam   [place], [place]   2 years ago Annual physical exam   [place], [place]   3 years ago Annual physical exam   [place], [place]

## 2019-08-02 ENCOUNTER — Other Ambulatory Visit: Payer: Self-pay | Admitting: Family Medicine

## 2019-08-02 DIAGNOSIS — F4001 Agoraphobia with panic disorder: Secondary | ICD-10-CM

## 2019-09-01 ENCOUNTER — Other Ambulatory Visit: Payer: Self-pay | Admitting: Family Medicine

## 2019-09-01 DIAGNOSIS — F4001 Agoraphobia with panic disorder: Secondary | ICD-10-CM

## 2019-09-01 DIAGNOSIS — I10 Essential (primary) hypertension: Secondary | ICD-10-CM

## 2019-09-01 NOTE — Telephone Encounter (Signed)
Requested medication (s) are due for refill today: Yes  Requested medication (s) are on the active medication list: Yes  Last refill:  08/03/19  Future visit scheduled: No  Notes to clinic:  See request.    Requested Prescriptions  Pending Prescriptions Disp Refills   LORazepam (ATIVAN) 0.5 MG tablet [Pharmacy Med Name: LORAZEPAM 0.5 MG TAB] 60 tablet     Sig: TAKE 1 TABLET BY MOUTH TWICE DAILY AS NEEDED      Not Delegated - Psychiatry:  Anxiolytics/Hypnotics Failed - 09/01/2019 11:47 AM      Failed - This refill cannot be delegated      Failed - Urine Drug Screen completed in last 360 days.      Passed - Valid encounter within last 6 months    Recent Outpatient Visits           3 months ago Dysfunction of left eustachian tube   [place], [place]   4 months ago Annual physical exam   [place], [place]   1 year ago Annual physical exam   [place], [place]   2 years ago Annual physical exam   [place], [place]   3 years ago Annual physical exam   [place], [place]

## 2019-10-11 ENCOUNTER — Other Ambulatory Visit: Payer: Self-pay | Admitting: Family Medicine

## 2019-10-11 DIAGNOSIS — F4001 Agoraphobia with panic disorder: Secondary | ICD-10-CM

## 2019-10-11 DIAGNOSIS — G479 Sleep disorder, unspecified: Secondary | ICD-10-CM

## 2019-11-14 ENCOUNTER — Other Ambulatory Visit: Payer: Self-pay | Admitting: Family Medicine

## 2019-11-14 DIAGNOSIS — F4001 Agoraphobia with panic disorder: Secondary | ICD-10-CM

## 2019-11-14 NOTE — Telephone Encounter (Signed)
Requested medication (s) are due for refill today: yes  Requested medication (s) are on the active medication list: yes  Last refill:  09/01/19 #60  Future visit scheduled: no  Notes to clinic:  Please review for refill. Refill not delegated per protocol.    Requested Prescriptions  Pending Prescriptions Disp Refills   LORazepam (ATIVAN) 0.5 MG tablet [Pharmacy Med Name: LORAZEPAM 0.5 MG TAB] 60 tablet     Sig: TAKE 1 TABLET BY MOUTH TWICE DAILY AS NEEDED      Not Delegated - Psychiatry:  Anxiolytics/Hypnotics Failed - 11/14/2019 11:55 AM      Failed - This refill cannot be delegated      Failed - Urine Drug Screen completed in last 360 days.      Failed - Valid encounter within last 6 months    Recent Outpatient Visits           6 months ago Dysfunction of left eustachian tube   [place], Vickki Muff, [place]   6 months ago Annual physical exam   [place], [place]   1 year ago Annual physical exam   [place], [place]   2 years ago Annual physical exam   [place], [place]   3 years ago Annual physical exam   [facility], [place], [place]

## 2019-11-15 NOTE — Telephone Encounter (Signed)
Refilled today but pt will need appt with Shaun Griffith for further refills.

## 2019-11-15 NOTE — Telephone Encounter (Signed)
Please advise 

## 2019-11-16 NOTE — Progress Notes (Signed)
Established patient visit   Patient: Shaun Griffith   DOB: 1960/06/10   59 y.o. Male  MRN: [id number] Visit Date: 11/17/2019  Today's healthcare provider: Trinna Post, PA-C   Chief Complaint  Patient presents with  . Ear Pain   Subjective    Otalgia  There is pain in the left ear. The current episode started more than 1 month ago. The problem occurs constantly. The problem has been unchanged. There has been no fever. The pain is moderate. Pertinent negatives include no coughing, ear discharge or headaches. He has tried acetaminophen for the symptoms. The treatment provided mild relief.    Patient reports bilateral ear pain, left worse than right.  . Patient reports that it is like a fullness sensation. Was seen for this previously in 05/2019 and treated for eustachian tube dysfunction with 2nd generation antihistamine and flonase. Reports this helped but ultimately he stopped using these consistently. Has recently started them back up again in the past few days.      Medications: Outpatient Medications Prior to Visit  Medication Sig  . LORazepam (ATIVAN) 0.5 MG tablet TAKE 1 TABLET BY MOUTH TWICE DAILY AS NEEDED  . metoprolol succinate (TOPROL-XL) 50 MG 24 hr tablet TAKE ONE TABLET (50 MG) BY MOUTH EVERY DAY  . traZODone (DESYREL) 50 MG tablet TAKE 1/2 TO 1 TABLET BY MOUTH AT BEDTIMEAS NEEDED FOR SLEEP  . b complex-C-folic acid 1 MG capsule Take 1 capsule by mouth daily.   No facility-administered medications prior to visit.    Review of Systems  HENT: Positive for ear pain. Negative for ear discharge.   Respiratory: Negative for cough.   Neurological: Negative for headaches.      Objective    BP 125/86   Pulse (!) 106   Temp 98.3 F (36.8 C)   Ht 6\' 2"  (1.88 m)   Wt 297 lb (134.7 kg)   BMI 38.13 kg/m    Physical Exam Constitutional:      Appearance: He is obese.  HENT:     Right Ear: Tympanic membrane and ear canal normal.     Left Ear: Ear canal  normal.     Ears:     Comments: Opaque left TM Cardiovascular:     Rate and Rhythm: Regular rhythm.  Pulmonary:     Effort: Pulmonary effort is normal.  Skin:    General: Skin is warm and dry.  Neurological:     Mental Status: He is alert and oriented to person, place, and time. Mental status is at baseline.  Psychiatric:        Mood and Affect: Mood normal.        Behavior: Behavior normal.      No results found for any visits on 11/17/19.  Assessment & Plan    1. Otalgia of both ears  - Azelastine-Fluticasone (DYMISTA) 137-50 MCG/ACT SUSP; One spray per nostril 2x daily.  Dispense: 23 g; Refill: 0 - Ambulatory referral to ENT  2. Dysfunction of left eustachian tube  - Ambulatory referral to ENT    Return if symptoms worsen or fail to improve.      ITrinna Post, PA-C, have reviewed all documentation for this visit. The documentation on 11/20/19 for the exam, diagnosis, procedures, and orders are all accurate and complete.  The entirety of the information documented in the History of Present Illness, Review of Systems and Physical Exam were personally obtained by me. Portions of this information were  initially documented by Wilburt Finlay, CMA and reviewed by me for thoroughness and accuracy.     Paulene Floor  [facility] [phone] (phone) [phone] (fax)  [place]

## 2019-11-17 ENCOUNTER — Other Ambulatory Visit: Payer: Self-pay

## 2019-11-17 ENCOUNTER — Encounter: Payer: Self-pay | Admitting: Physician Assistant

## 2019-11-17 ENCOUNTER — Ambulatory Visit (INDEPENDENT_AMBULATORY_CARE_PROVIDER_SITE_OTHER): Payer: 59 | Admitting: Physician Assistant

## 2019-11-17 VITALS — BP 125/86 | HR 106 | Temp 98.3°F | Ht 74.0 in | Wt 297.0 lb

## 2019-11-17 DIAGNOSIS — H9203 Otalgia, bilateral: Secondary | ICD-10-CM

## 2019-11-17 DIAGNOSIS — H6982 Other specified disorders of Eustachian tube, left ear: Secondary | ICD-10-CM | POA: Diagnosis not present

## 2019-11-17 DIAGNOSIS — H6992 Unspecified Eustachian tube disorder, left ear: Secondary | ICD-10-CM

## 2019-11-17 MED ORDER — AZELASTINE-FLUTICASONE 137-50 MCG/ACT NA SUSP: NASAL | 0 refills | Status: DC

## 2020-01-29 ENCOUNTER — Other Ambulatory Visit: Payer: Self-pay | Admitting: Family Medicine

## 2020-01-29 DIAGNOSIS — F4001 Agoraphobia with panic disorder: Secondary | ICD-10-CM

## 2020-01-29 NOTE — Telephone Encounter (Signed)
Requested medication (s) are due for refill today: yes  Requested medication (s) are on the active medication list: yes  Last refill:  11/15/19  #60  0 refills  Future visit scheduled: No  Notes to clinic:  Not delegated    Requested Prescriptions  Pending Prescriptions Disp Refills   LORazepam (ATIVAN) 0.5 MG tablet [Pharmacy Med Name: LORAZEPAM 0.5 MG TAB] 60 tablet     Sig: TAKE 1 TABLET BY MOUTH TWICE DAILY AS NEEDED      Not Delegated - Psychiatry:  Anxiolytics/Hypnotics Failed - 01/29/2020 11:37 AM      Failed - This refill cannot be delegated      Failed - Urine Drug Screen completed in last 360 days      Passed - Valid encounter within last 6 months    Recent Outpatient Visits           2 months ago Otalgia of both [place], [place], PA-C   8 months ago Dysfunction of left eustachian tube   [place], Vickki Muff, [place]   9 months ago Annual physical exam   [place], [place]   2 years ago Annual physical exam   [place], [place]   3 years ago Annual physical exam   [facility], [place], [place]

## 2020-03-05 ENCOUNTER — Other Ambulatory Visit: Payer: Self-pay | Admitting: Family Medicine

## 2020-03-05 DIAGNOSIS — I10 Essential (primary) hypertension: Secondary | ICD-10-CM

## 2020-04-22 ENCOUNTER — Other Ambulatory Visit: Payer: Self-pay | Admitting: Family Medicine

## 2020-04-22 DIAGNOSIS — G479 Sleep disorder, unspecified: Secondary | ICD-10-CM

## 2020-04-22 DIAGNOSIS — F4001 Agoraphobia with panic disorder: Secondary | ICD-10-CM

## 2020-04-22 NOTE — Telephone Encounter (Signed)
Requested medication (s) are due for refill today: yes  Requested medication (s) are on the active medication list: yes  Last refill: 01/29/20  # 60  0 refills  Future visit scheduled no  Notes to clinic: not delegated  Requested Prescriptions  Pending Prescriptions Disp Refills   LORazepam (ATIVAN) 0.5 MG tablet [Pharmacy Med Name: LORAZEPAM 0.5 MG TAB] 60 tablet     Sig: TAKE 1 TABLET BY MOUTH TWICE DAILY AS NEEDED      Not Delegated - Psychiatry:  Anxiolytics/Hypnotics Failed - 04/22/2020 10:04 AM      Failed - This refill cannot be delegated      Failed - Urine Drug Screen completed in last 360 days      Passed - Valid encounter within last 6 months    Recent Outpatient Visits           5 months ago Otalgia of both [place], [place]   11 months ago Dysfunction of left eustachian tube   [place], Vickki Muff, PA-C   1 year ago Annual physical exam   [place], PA-C   2 years ago Annual physical exam   [facility], Vickki Muff, PA-C   3 years ago Annual physical exam   [place], PA-C                 Signed Prescriptions Disp Refills   traZODone (DESYREL) 50 MG tablet 30 tablet 0    Sig: TAKE 1/2 TO 1 TABLET BY MOUTH AT [place]      Psychiatry: Antidepressants - Serotonin Modulator Passed - 04/22/2020 10:04 AM      Passed - Valid encounter within last 6 months    Recent Outpatient Visits           5 months ago Otalgia of both [place], [place], [place]   11 months ago Dysfunction of left eustachian tube   [place], Vickki Muff, PA-C   1 year ago Annual physical exam   [place], PA-C   2 years ago Annual physical exam   [place], PA-C   3 years ago Annual  physical exam   [facility], Vickki Muff, [place]

## 2020-06-04 ENCOUNTER — Other Ambulatory Visit: Payer: Self-pay | Admitting: Family Medicine

## 2020-06-04 DIAGNOSIS — G479 Sleep disorder, unspecified: Secondary | ICD-10-CM

## 2020-06-04 DIAGNOSIS — F4001 Agoraphobia with panic disorder: Secondary | ICD-10-CM

## 2020-06-04 NOTE — Telephone Encounter (Signed)
Requested medication (s) are due for refill today:  yes  Requested medication (s) are on the active medication list:  yes  Last refill:  04/22/2020  Future visit scheduled: no  Notes to clinic:  overdue for follow up appointment Message has been sent to patient to schedule    Requested Prescriptions  Pending Prescriptions Disp Refills   traZODone (DESYREL) 50 MG tablet [Pharmacy Med Name: TRAZODONE HCL 50 MG TAB] 30 tablet 0    Sig: TAKE 1/2 TO 1 TABLET AT BEDTIME AS NEEDED FOR SLEEP      Psychiatry: Antidepressants - Serotonin Modulator Failed - 06/04/2020  9:40 AM      Failed - Valid encounter within last 6 months    Recent Outpatient Visits           6 months ago Otalgia of both [place], [place], PA-C   1 year ago Dysfunction of left eustachian tube   [place], Vickki Muff, PA-C   1 year ago Annual physical exam   [place], PA-C   2 years ago Annual physical exam   [place], PA-C   3 years ago Annual physical exam   [facility], Vickki Muff, [place]

## 2020-06-06 ENCOUNTER — Other Ambulatory Visit: Payer: Self-pay | Admitting: Family Medicine

## 2020-06-06 DIAGNOSIS — I10 Essential (primary) hypertension: Secondary | ICD-10-CM

## 2020-06-06 MED ORDER — METOPROLOL SUCCINATE ER 50 MG PO TB24: 50.0000 mg | ORAL_TABLET | Freq: Every day | ORAL | 0 refills | Status: DC

## 2020-06-06 NOTE — Telephone Encounter (Signed)
Please review. Dennis pt. Has an appt with you on 4/14. Thanks!

## 2020-06-18 DIAGNOSIS — R202 Paresthesia of skin: Secondary | ICD-10-CM | POA: Insufficient documentation

## 2020-06-18 DIAGNOSIS — M79604 Pain in right leg: Secondary | ICD-10-CM | POA: Insufficient documentation

## 2020-06-20 ENCOUNTER — Other Ambulatory Visit: Payer: Self-pay | Admitting: Family Medicine

## 2020-06-20 ENCOUNTER — Other Ambulatory Visit: Payer: Self-pay

## 2020-06-20 ENCOUNTER — Ambulatory Visit (INDEPENDENT_AMBULATORY_CARE_PROVIDER_SITE_OTHER): Payer: 59 | Admitting: Adult Health

## 2020-06-20 VITALS — BP 151/87 | HR 92 | Ht 75.0 in | Wt 301.4 lb

## 2020-06-20 DIAGNOSIS — F4001 Agoraphobia with panic disorder: Secondary | ICD-10-CM

## 2020-06-20 DIAGNOSIS — I10 Essential (primary) hypertension: Secondary | ICD-10-CM

## 2020-06-20 DIAGNOSIS — F419 Anxiety disorder, unspecified: Secondary | ICD-10-CM | POA: Diagnosis not present

## 2020-06-20 NOTE — Patient Instructions (Signed)
Metoprolol Extended-Release Capsules What is this medicine? METOPROLOL (me TOE proe lole) is a beta blocker. It decreases the amount of work your heart has to do and helps your heart beat regularly. It treats high blood pressure and/or prevents chest pain (also called angina). It also treats heart failure. This medicine may be used for other purposes; ask your health care provider or pharmacist if you have questions. COMMON BRAND NAME(S): KAPSPARGO What should I tell my health care provider before I take this medicine? They need to know if you have any of these conditions:  diabetes  heart disease  liver disease  lung or breathing disease, like asthma  pheochromocytoma  thyroid disease  an unusual or allergic reaction to metoprolol, other beta-blockers, medicines, foods, dyes, or preservatives  pregnant or trying to get pregnant  breast-feeding How should I use this medicine? Take this drug by mouth with water. Take it as directed on the prescription label at the same time every day. Do not cut, crush or chew this drug. Swallow the capsules whole. You may open the capsule and put the contents in 1 teaspoon of applesauce. Swallow the drug and applesauce right away. Do not chew the drug or applesauce. Keep taking it unless your health care provider tells you to stop. Talk to your health care provider about the use of this drug in children. While it may be prescribed for children as young as 6 for selected conditions, precautions do apply. Overdosage: If you think you have taken too much of this medicine contact a poison control center or emergency room at once. NOTE: This medicine is only for you. Do not share this medicine with others. What if I miss a dose? If you miss a dose, take it as soon as you can. If it is almost time for your next dose, take only that dose. Do not take double or extra doses. What may interact with this medicine? This medicine may interact with the following  medications:  certain medicines for blood pressure, heart disease, irregular heart beat  epinephrine  fluoxetine  MAOIs like Carbex, Eldepryl, Marplan, Nardil, and Parnate  paroxetine  reserpine This list may not describe all possible interactions. Give your health care provider a list of all the medicines, herbs, non-prescription drugs, or dietary supplements you use. Also tell them if you smoke, drink alcohol, or use illegal drugs. Some items may interact with your medicine. What should I watch for while using this medicine? You may get drowsy or dizzy. Do not drive, use machinery, or do anything that needs mental alertness until you know how this medicine affects you. Do not stand or sit up quickly, especially if you are an older patient. This reduces the risk of dizzy or fainting spells. Alcohol may interfere with the effect of this medicine. Avoid alcoholic drinks. Visit your doctor or health care professional for regular checks on your progress. Check your blood pressure as directed. Ask your doctor or health care professional what your blood pressure should be and when you should contact him or her. Do not treat yourself for coughs, colds, or pain while you are using this medicine without asking your doctor or health care professional for advice. Some ingredients may increase your blood pressure. This medicine may increase blood sugar. Ask your healthcare provider if changes in diet or medicines are needed if you have diabetes. What side effects may I notice from receiving this medicine? Side effects that you should report to your doctor or health care  professional as soon as possible:  allergic reactions like skin rash, itching or hives, swelling of the face, lips, or tongue  cold hands or feet  signs and symptoms of high blood sugar such as being more thirsty or hungry or having to urinate more than normal. You may also feel very tired or have blurry vision.  signs and symptoms  of low blood pressure like dizziness; feeling faint or lightheaded, falls; unusually weak or tired  signs of worsening heart failure like breathing problems, swelling in your legs and feet  suicidal thoughts or other mood changes  unusually slow heartbeat Side effects that usually do not require medical attention (report these to your doctor or health care professional if they continue or are bothersome):  anxious  change in sex drive or performance  diarrhea  headache  trouble sleeping  upset stomach This list may not describe all possible side effects. Call your doctor for medical advice about side effects. You may report side effects to FDA at 1-800-FDA-1088. Where should I keep my medicine? Keep out of the reach of children and pets. Store at room temperature between 20 and 25 degrees C (68 and 77 degrees F). Throw away any unused drug after the expiration date. NOTE: This sheet is a summary. It may not cover all possible information. If you have questions about this medicine, talk to your doctor, pharmacist, or health care provider.  2021 Elsevier/Gold Standard (2018-11-23 13:24:03) Hypertension, Adult Hypertension is another name for high blood pressure. High blood pressure forces your heart to work harder to pump blood. This can cause problems over time. There are two numbers in a blood pressure reading. There is a top number (systolic) over a bottom number (diastolic). It is best to have a blood pressure that is below 120/80. Healthy choices can help lower your blood pressure, or you may need medicine to help lower it. What are the causes? The cause of this condition is not known. Some conditions may be related to high blood pressure. What increases the risk?  Smoking.  Having type 2 diabetes mellitus, high cholesterol, or both.  Not getting enough exercise or physical activity.  Being overweight.  Having too much fat, sugar, calories, or salt (sodium) in your  diet.  Drinking too much alcohol.  Having long-term (chronic) kidney disease.  Having a family history of high blood pressure.  Age. Risk increases with age.  Race. You may be at higher risk if you are African American.  Gender. Men are at higher risk than women before age 72. After age 59, women are at higher risk than men.  Having obstructive sleep apnea.  Stress. What are the signs or symptoms?  High blood pressure may not cause symptoms. Very high blood pressure (hypertensive crisis) may cause: ? Headache. ? Feelings of worry or nervousness (anxiety). ? Shortness of breath. ? Nosebleed. ? A feeling of being sick to your stomach (nausea). ? Throwing up (vomiting). ? Changes in how you see. ? Very bad chest pain. ? Seizures. How is this treated?  This condition is treated by making healthy lifestyle changes, such as: ? Eating healthy foods. ? Exercising more. ? Drinking less alcohol.  Your health care provider may prescribe medicine if lifestyle changes are not enough to get your blood pressure under control, and if: ? Your top number is above 130. ? Your bottom number is above 80.  Your personal target blood pressure may vary. Follow these instructions at home: Eating and drinking  If told, follow the DASH eating plan. To follow this plan: ? Fill one half of your plate at each meal with fruits and vegetables. ? Fill one fourth of your plate at each meal with whole grains. Whole grains include whole-wheat pasta, brown rice, and whole-grain bread. ? Eat or drink low-fat dairy products, such as skim milk or low-fat yogurt. ? Fill one fourth of your plate at each meal with low-fat (lean) proteins. Low-fat proteins include fish, chicken without skin, eggs, beans, and tofu. ? Avoid fatty meat, cured and processed meat, or chicken with skin. ? Avoid pre-made or processed food.  Eat less than 1,500 mg of salt each day.  Do not drink alcohol if: ? Your doctor tells  you not to drink. ? You are pregnant, may be pregnant, or are planning to become pregnant.  If you drink alcohol: ? Limit how much you use to:  0-1 drink a day for women.  0-2 drinks a day for men. ? Be aware of how much alcohol is in your drink. In the [place] one drink equals one 12 oz bottle of beer (355 mL), one 5 oz glass of wine (148 mL), or one 1 oz glass of hard liquor (44 mL).   Lifestyle  Work with your doctor to stay at a healthy weight or to lose weight. Ask your doctor what the best weight is for you.  Get at least 30 minutes of exercise most days of the week. This may include walking, swimming, or biking.  Get at least 30 minutes of exercise that strengthens your muscles (resistance exercise) at least 3 days a week. This may include lifting weights or doing Pilates.  Do not use any products that contain nicotine or tobacco, such as cigarettes, e-cigarettes, and chewing tobacco. If you need help quitting, ask your doctor.  Check your blood pressure at home as told by your doctor.  Keep all follow-up visits as told by your doctor. This is important.   Medicines  Take over-the-counter and prescription medicines only as told by your doctor. Follow directions carefully.  Do not skip doses of blood pressure medicine. The medicine does not work as well if you skip doses. Skipping doses also puts you at risk for problems.  Ask your doctor about side effects or reactions to medicines that you should watch for. Contact a doctor if you:  Think you are having a reaction to the medicine you are taking.  Have headaches that keep coming back (recurring).  Feel dizzy.  Have swelling in your ankles.  Have trouble with your vision. Get help right away if you:  Get a very bad headache.  Start to feel mixed up (confused).  Feel weak or numb.  Feel faint.  Have very bad pain in your: ? Chest. ? Belly (abdomen).  Throw up more than once.  Have trouble  breathing. Summary  Hypertension is another name for high blood pressure.  High blood pressure forces your heart to work harder to pump blood.  For most people, a normal blood pressure is less than 120/80.  Making healthy choices can help lower blood pressure. If your blood pressure does not get lower with healthy choices, you may need to take medicine. This information is not intended to replace advice given to you by your health care provider. Make sure you discuss any questions you have with your health care provider. Document Revised: 11/03/2017 Document Reviewed: 11/03/2017 Elsevier Patient Education  2021 [employer].

## 2020-06-20 NOTE — Progress Notes (Addendum)
Established patient visit   Patient: Shaun Griffith   DOB: August 08, 1960   60 y.o. Male  MRN: [id number] Visit Date: 06/20/2020  Today's healthcare provider: Marcille Buffy, FNP   Chief Complaint  Patient presents with  . Medication Refill   Subjective    HPI  Hypertension, follow-up  BP Readings from Last 3 Encounters:  06/20/20 (!) 151/87  11/17/19 125/86  04/20/19 (!) 146/90   Wt Readings from Last 3 Encounters:  06/20/20 (!) 301 lb 6.4 oz (136.7 kg)  11/17/19 297 lb (134.7 kg)  04/20/19 297 lb (134.7 kg)     He was last seen for hypertension 14 months ago.  BP at that visit was 146/90. Management since that visit includes none; continue Metoprolol. He has not been out of medications.   He reports excellent compliance with treatment. He is not having side effects.  He is following a Regular diet. - over eating  He is exercising. - chiropractor He does not smoke.  Use of agents associated with hypertension: none.   Outside blood pressures are n/a.  Pertinent labs: Lab Results  Component Value Date   CHOL 164 04/20/2019   HDL 51 04/20/2019   LDLCALC 88 04/20/2019   TRIG 143 04/20/2019   CHOLHDL 2.9 01/20/2018   Lab Results  Component Value Date   NA 139 04/20/2019   K 4.1 04/20/2019   CREATININE 0.73 (L) 04/20/2019   GFRNONAA 102 04/20/2019   GFRAA 117 04/20/2019   GLUCOSE 87 04/20/2019     The 10-year ASCVD risk score Mikey Bussing DC Jr., et al., 2013) is: 17.3%   --------------------------------------------------------------------------------------------------- Anxiety, Follow-up  He was last seen for anxiety 14 months ago. Changes made at last visit include traZODone (DESYREL) 50 MG tablet; Take 0.5-1 tablets (25-50 mg total) by mouth at bedtime as needed for sleep.  Has not been out of medications.  He reports excellent compliance with treatment. He reports excellent tolerance of treatment. He is not having side effects.   He feels  his anxiety is mild and Improved since last visit.   GAD-7 Results No flowsheet data found.  PHQ-9 Scores PHQ9 SCORE ONLY 06/20/2020 04/20/2019 01/20/2018  PHQ-9 Total Score 6 8 0    Patient  denies any fever, body aches,chills, rash, chest pain, shortness of breath, nausea, vomiting, or diarrhea.  Denies dizziness, lightheadedness, pre syncopal or syncopal episodes.      Medications: Outpatient Medications Prior to Visit  Medication Sig  . Azelastine-Fluticasone (DYMISTA) 137-50 MCG/ACT SUSP One spray per nostril 2x daily.  Marland Kitchen gabapentin (NEURONTIN) 100 MG capsule Take 100 mg by mouth 3 (three) times daily.  . metoprolol succinate (TOPROL-XL) 50 MG 24 hr tablet Take 1 tablet (50 mg total) by mouth daily. Take with or immediately following a meal.  . traZODone (DESYREL) 50 MG tablet TAKE 1/2 TO 1 TABLET AT BEDTIME AS NEEDED FOR SLEEP  . [DISCONTINUED] LORazepam (ATIVAN) 0.5 MG tablet TAKE 1 TABLET BY MOUTH TWICE DAILY AS NEEDED  . b complex-C-folic acid 1 MG capsule Take 1 capsule by mouth daily.   No facility-administered medications prior to visit.    Review of Systems  Constitutional: Negative.   Respiratory: Negative.   Cardiovascular: Negative.   Gastrointestinal: Negative.   Genitourinary: Negative.   Musculoskeletal: Negative.  Negative for joint swelling.  Skin: Negative.   Neurological: Negative.   Hematological: Negative.   Psychiatric/Behavioral: Negative.        Objective    BP Marland Kitchen)  151/87 (BP Location: Right Arm, Patient Position: Sitting, Cuff Size: Large)   Pulse 92   Ht 6\' 3"  (1.905 m)   Wt (!) 301 lb 6.4 oz (136.7 kg)   SpO2 96%   BMI 37.67 kg/m     Physical Exam Vitals reviewed.  Constitutional:      General: He is not in acute distress.    Appearance: Normal appearance. He is well-developed. He is not ill-appearing, toxic-appearing or diaphoretic.  HENT:     Head: Normocephalic and atraumatic.     Right Ear: Hearing, tympanic membrane, ear  canal and external ear normal.     Left Ear: Hearing, tympanic membrane, ear canal and external ear normal.     Nose: Nose normal.     Mouth/Throat:     Pharynx: Uvula midline. No oropharyngeal exudate.  Eyes:     General: Lids are normal. No scleral icterus.       Right eye: No discharge.        Left eye: No discharge.     Conjunctiva/sclera: Conjunctivae normal.     Pupils: Pupils are equal, round, and reactive to light.  Neck:     Thyroid: No thyromegaly.     Vascular: Normal carotid pulses. No carotid bruit, hepatojugular reflux or JVD.     Trachea: Trachea and phonation normal. No tracheal tenderness or tracheal deviation.     Meningeal: Brudzinski's sign absent.  Cardiovascular:     Rate and Rhythm: Normal rate and regular rhythm.     Pulses: Normal pulses.     Heart sounds: Normal heart sounds, S1 normal and S2 normal. Heart sounds not distant. No murmur heard. No friction rub. No gallop.   Pulmonary:     Effort: Pulmonary effort is normal. No accessory muscle usage or respiratory distress.     Breath sounds: Normal breath sounds. No stridor. No wheezing or rales.  Chest:     Chest wall: No tenderness.  Abdominal:     General: Bowel sounds are normal. There is no distension.     Palpations: Abdomen is soft. There is no mass.     Tenderness: There is no abdominal tenderness. There is no guarding or rebound.  Musculoskeletal:        General: No tenderness or deformity. Normal range of motion.     Cervical back: Full passive range of motion without pain, normal range of motion and neck supple.  Lymphadenopathy:     Head:     Right side of head: No submental, submandibular, tonsillar, preauricular, posterior auricular or occipital adenopathy.     Left side of head: No submental, submandibular, tonsillar, preauricular, posterior auricular or occipital adenopathy.     Cervical: No cervical adenopathy.  Skin:    General: Skin is warm and dry.     Coloration: Skin is not pale.      Findings: No erythema or rash.     Nails: There is no clubbing.  Neurological:     Mental Status: He is alert and oriented to person, place, and time.     GCS: GCS eye subscore is 4. GCS verbal subscore is 5. GCS motor subscore is 6.     Cranial Nerves: No cranial nerve deficit.     Sensory: No sensory deficit.     Motor: No abnormal muscle tone.     Coordination: Coordination normal.     Deep Tendon Reflexes: Reflexes are normal and symmetric. Reflexes normal.  Psychiatric:  Speech: Speech normal.        Behavior: Behavior normal.        Thought Content: Thought content normal.        Judgment: Judgment normal.     No results found for any visits on 06/20/20.  Assessment & Plan     Primary hypertension - Plan: Comprehensive Metabolic Panel (CMET), TSH, CBC, B12, Lipid Panel w/o Chol/HDL Ratio  Anxiety disorder, unspecified type  No orders of the defined types were placed in this encounter.   Meds ordered this encounter  Medications  . metoprolol succinate (TOPROL-XL) 50 MG 24 hr tablet    Sig: Take 1 tablet (50 mg total) by mouth daily. Take with or immediately following a meal.    Dispense:  90 tablet    Refill:  0   Orders Placed This Encounter  Procedures  . Comprehensive Metabolic Panel (CMET)  . TSH  . CBC  . B12  . Lipid Panel w/o Chol/HDL Ratio   No orders of the defined types were placed in this encounter.   Return in about 1 month (around 07/20/2020), or if symptoms worsen or fail to improve, for at any time for any worsening symptoms, Go to Emergency room/ urgent care if worse.      The entirety of the information documented in the History of Present Illness, Review of Systems and Physical Exam were personally obtained by me. Portions of this information were initially documented by the CMA and reviewed by me for thoroughness and accuracy.      Marcille Buffy, [place] [phone] (phone) [phone]  (fax)  [place]

## 2020-06-21 MED ORDER — LORAZEPAM 0.5 MG PO TABS: 0.5000 mg | ORAL_TABLET | Freq: Two times a day (BID) | ORAL | 0 refills | Status: DC | PRN

## 2020-06-24 ENCOUNTER — Encounter: Payer: Self-pay | Admitting: Adult Health

## 2020-06-24 MED ORDER — METOPROLOL SUCCINATE ER 50 MG PO TB24: 50.0000 mg | ORAL_TABLET | Freq: Every day | ORAL | 0 refills | Status: DC

## 2020-07-10 ENCOUNTER — Other Ambulatory Visit: Payer: Self-pay | Admitting: Family Medicine

## 2020-07-10 ENCOUNTER — Other Ambulatory Visit: Payer: Self-pay | Admitting: Adult Health

## 2020-07-10 DIAGNOSIS — G479 Sleep disorder, unspecified: Secondary | ICD-10-CM

## 2020-07-10 DIAGNOSIS — F4001 Agoraphobia with panic disorder: Secondary | ICD-10-CM

## 2020-07-10 DIAGNOSIS — I10 Essential (primary) hypertension: Secondary | ICD-10-CM

## 2020-07-10 NOTE — Telephone Encounter (Signed)
Requested Prescriptions  Pending Prescriptions Disp Refills  . traZODone (DESYREL) 50 MG tablet [Pharmacy Med Name: TRAZODONE HCL 50 MG TAB] 30 tablet 0    Sig: TAKE 1/2 TO 1 TABLET AT BEDTIME AS NEEDED FOR SLEEP     Psychiatry: Antidepressants - Serotonin Modulator Passed - 07/10/2020  1:59 PM      Passed - Valid encounter within last 6 months    Recent Outpatient Visits          2 weeks ago Primary hypertension   [place], Kelby Aline, FNP   7 months ago Otalgia of both [place], [place], [place]   1 year ago Dysfunction of left eustachian tube   [place], Vickki Muff, PA-C   1 year ago Annual physical exam   [place], PA-C   2 years ago Annual physical exam   [place], PA-C      Future Appointments            In 2 weeks Chrism[place], Vickki Muff, PA-C [employer], [place]

## 2020-07-29 ENCOUNTER — Ambulatory Visit: Payer: Self-pay | Admitting: Family Medicine

## 2020-08-13 ENCOUNTER — Ambulatory Visit: Payer: Self-pay | Admitting: Family Medicine

## 2020-08-13 ENCOUNTER — Other Ambulatory Visit: Payer: Self-pay | Admitting: Family Medicine

## 2020-08-13 DIAGNOSIS — G479 Sleep disorder, unspecified: Secondary | ICD-10-CM

## 2020-08-13 DIAGNOSIS — F4001 Agoraphobia with panic disorder: Secondary | ICD-10-CM

## 2020-08-21 ENCOUNTER — Other Ambulatory Visit: Payer: Self-pay | Admitting: Adult Health

## 2020-08-21 DIAGNOSIS — F4001 Agoraphobia with panic disorder: Secondary | ICD-10-CM

## 2020-08-22 MED ORDER — LORAZEPAM 0.5 MG PO TABS: 0.5000 mg | ORAL_TABLET | Freq: Two times a day (BID) | ORAL | 0 refills | Status: DC | PRN

## 2020-08-30 ENCOUNTER — Encounter: Payer: 59 | Admitting: Family Medicine

## 2020-09-20 ENCOUNTER — Encounter: Payer: Self-pay | Admitting: Family Medicine

## 2020-09-20 ENCOUNTER — Ambulatory Visit (INDEPENDENT_AMBULATORY_CARE_PROVIDER_SITE_OTHER): Payer: 59 | Admitting: Family Medicine

## 2020-09-20 ENCOUNTER — Other Ambulatory Visit: Payer: Self-pay

## 2020-09-20 VITALS — BP 141/88 | HR 71 | Temp 98.2°F | Resp 16 | Ht 75.0 in | Wt 292.0 lb

## 2020-09-20 DIAGNOSIS — F4001 Agoraphobia with panic disorder: Secondary | ICD-10-CM

## 2020-09-20 DIAGNOSIS — Z0000 Encounter for general adult medical examination without abnormal findings: Secondary | ICD-10-CM | POA: Diagnosis not present

## 2020-09-20 DIAGNOSIS — G5711 Meralgia paresthetica, right lower limb: Secondary | ICD-10-CM

## 2020-09-20 DIAGNOSIS — I10 Essential (primary) hypertension: Secondary | ICD-10-CM | POA: Diagnosis not present

## 2020-09-20 DIAGNOSIS — G4733 Obstructive sleep apnea (adult) (pediatric): Secondary | ICD-10-CM

## 2020-09-20 DIAGNOSIS — G479 Sleep disorder, unspecified: Secondary | ICD-10-CM

## 2020-09-20 DIAGNOSIS — Z125 Encounter for screening for malignant neoplasm of prostate: Secondary | ICD-10-CM

## 2020-09-20 MED ORDER — METOPROLOL SUCCINATE ER 50 MG PO TB24
50.0000 mg | ORAL_TABLET | Freq: Every day | ORAL | 3 refills | Status: DC
Start: 2020-09-20 — End: 2021-09-16

## 2020-09-20 MED ORDER — TRAZODONE HCL 50 MG PO TABS: ORAL_TABLET | ORAL | 6 refills | Status: DC

## 2020-09-20 MED ORDER — GABAPENTIN 300 MG PO CAPS: ORAL_CAPSULE | ORAL | 3 refills | Status: DC

## 2020-09-20 NOTE — Progress Notes (Signed)
I,Shaun Griffith,acting as a Education administrator for [employer], PA-C.,have documented all relevant documentation on the behalf of Vernie Murders, PA-C,as directed by  [employer], PA-C while in the presence of [employer], PA-C.   Complete physical exam   Patient: Shaun Griffith   DOB: 12/22/1960   60 y.o. Male  MRN: [id number] Visit Date: 09/20/2020  Today's healthcare provider: Vernie Murders, PA-C   Chief Complaint  Patient presents with   Annual Exam   Subjective    Shaun Griffith is a 60 y.o. male who presents today for a complete physical exam.  He reports consuming a general diet. Home exercise routine includes biking. He generally feels well. He reports sleeping well. He does not have additional problems to discuss today.    Past Medical History:  Diagnosis Date   Anxiety    Depression    Hypertension    Past Surgical History:  Procedure Laterality Date   BACK SURGERY  1990   HNP L5   COLONOSCOPY     COLONOSCOPY WITH PROPOFOL N/A 05/13/2017   Procedure: COLONOSCOPY WITH PROPOFOL;  Surgeon: Jonathon Bellows, MD;  Location: [facility] ENDOSCOPY;  Service: Gastroenterology;  Laterality: N/A;   HERNIA REPAIR  1984   right groin   KNEE SURGERY Left 1992   Social History   Socioeconomic History   Marital status: Divorced    Spouse name: Not on file   Number of children: Not on file   Years of education: Not on file   Highest education level: Not on file  Occupational History   Not on file  Tobacco Use   Smoking status: Some Days    Types: Cigarettes   Smokeless tobacco: Never   Tobacco comments:    1 pack per week  Vaping Use   Vaping Use: Never used  Substance and Sexual Activity   Alcohol use: Yes    Alcohol/week: 12.0 standard drinks    Types: 12 Cans of beer per week    Comment: Moderate alcohol use, drinks beer 3-4 days a week; drinks 8-10 beers   Drug use: No   Sexual activity: Yes  Other Topics Concern   Not on file  Social History Narrative   Not  on file   Social Determinants of Health   Financial Resource Strain: Not on file  Food Insecurity: Not on file  Transportation Needs: Not on file  Physical Activity: Not on file  Stress: Not on file  Social Connections: Not on file  Intimate Partner Violence: Not on file   Family Status  Relation Name Status   Mother  Deceased at age 82       Died from a stroke   Father  Deceased at age 18       Had kidney failure   MGM  Deceased   PGM  Deceased   PGF  Deceased   Family History  Adopted: Yes  Problem Relation Age of Onset   Diabetes Mother    Depression Mother    Cancer Father        skin and blood   Allergies  Allergen Reactions   Sulfadimethoxine Nausea Only    flu like symptoms    Patient Care Team: Zoran Yankee, Driscilla Grammes as PCP - General (Physician Assistant)   Medications: Outpatient Medications Prior to Visit  Medication Sig   Azelastine-Fluticasone (DYMISTA) 137-50 MCG/ACT SUSP One spray per nostril 2x daily.   b complex-C-folic acid 1 MG capsule Take 1 capsule by mouth  daily.   gabapentin (NEURONTIN) 300 MG capsule Take 300mg  in the morning, 300mg  in the afternoon, and 600mg  at night.   LORazepam (ATIVAN) 0.5 MG tablet Take 1 tablet (0.5 mg total) by mouth 2 (two) times daily as needed.   metoprolol succinate (TOPROL-XL) 50 MG 24 hr tablet Take 1 tablet (50 mg total) by mouth daily. Take with or immediately following a meal.   traZODone (DESYREL) 50 MG tablet TAKE 1/2 TO 1 TABLET AT BEDTIME AS NEEDED FOR SLEEP   [DISCONTINUED] gabapentin (NEURONTIN) 100 MG capsule Take 100 mg by mouth 3 (three) times daily.   No facility-administered medications prior to visit.    Review of Systems  Respiratory:  Positive for apnea.   Musculoskeletal:  Positive for arthralgias.  All other systems reviewed and are negative.    Objective    BP (!) 141/88 (BP Location: Right Arm, Patient Position: Sitting, Cuff Size: Large)   Pulse 71   Temp 98.2 F (36.8 C)  (Oral)   Resp 16   Ht 6\' 3"  (1.905 m)   Wt 292 lb (132.5 kg)   SpO2 96%   BMI 36.50 kg/m  BP Readings from Last 3 Encounters:  09/20/20 (!) 141/88  06/20/20 (!) 151/87  11/17/19 125/86   Wt Readings from Last 3 Encounters:  09/20/20 292 lb (132.5 kg)  06/20/20 (!) 301 lb 6.4 oz (136.7 kg)  11/17/19 297 lb (134.7 kg)    Physical Exam Constitutional:      Appearance: He is well-developed.  HENT:     Head: Normocephalic and atraumatic.     Right Ear: External ear normal.     Left Ear: External ear normal.     Nose: Nose normal.  Eyes:     General:        Right eye: No discharge.     Conjunctiva/sclera: Conjunctivae normal.     Pupils: Pupils are equal, round, and reactive to light.  Neck:     Thyroid: No thyromegaly.     Trachea: No tracheal deviation.  Cardiovascular:     Rate and Rhythm: Normal rate and regular rhythm.     Heart sounds: Normal heart sounds. No murmur heard. Pulmonary:     Effort: Pulmonary effort is normal. No respiratory distress.     Breath sounds: Normal breath sounds. No wheezing or rales.  Chest:     Chest wall: No tenderness.  Abdominal:     General: There is no distension.     Palpations: Abdomen is soft. There is no mass.     Tenderness: There is no abdominal tenderness. There is no guarding or rebound.  Genitourinary:    Penis: Normal.      Testes: Normal.     Prostate: Normal.     Rectum: Normal. Guaiac result negative.  Musculoskeletal:        General: No tenderness. Normal range of motion.     Cervical back: Normal range of motion and neck supple.  Lymphadenopathy:     Cervical: No cervical adenopathy.  Skin:    General: Skin is warm and dry.     Findings: No erythema or rash.  Neurological:     Mental Status: He is alert and oriented to person, place, and time.     Cranial Nerves: No cranial nerve deficit.     Sensory: Sensory deficit present.     Motor: No abnormal muscle tone.     Coordination: Coordination normal.      Deep Tendon Reflexes: Reflexes  are normal and symmetric. Reflexes normal.     Comments: Tingling in the right anterior thigh.  Psychiatric:        Behavior: Behavior normal.        Thought Content: Thought content normal.        Judgment: Judgment normal.      Last depression screening scores PHQ 2/9 Scores 09/20/2020 06/20/2020 04/20/2019  PHQ - 2 Score 0 0 3  PHQ- 9 Score 3 6 8    Last fall risk screening Fall Risk  09/20/2020  Falls in the past year? 0  Number falls in past yr: 0  Injury with Fall? 0  Risk for fall due to : No Fall Risks  Follow up Falls evaluation completed   Last Audit-C alcohol use screening Alcohol Use Disorder Test (AUDIT) 09/20/2020  1. How often do you have a drink containing alcohol? 3  2. How many drinks containing alcohol do you have on a typical day when you are drinking? 1  3. How often do you have six or more drinks on one occasion? 0  AUDIT-C Score 4  4. How often during the last year have you found that you were not able to stop drinking once you had started? 0  5. How often during the last year have you failed to do what was normally expected from you because of drinking? 0  6. How often during the last year have you needed a first drink in the morning to get yourself going after a heavy drinking session? 0  7. How often during the last year have you had a feeling of guilt of remorse after drinking? 0  8. How often during the last year have you been unable to remember what happened the night before because you had been drinking? 0  9. Have you or someone else been injured as a result of your drinking? 0  10. Has a relative or friend or a doctor or another health worker been concerned about your drinking or suggested you cut down? 0  Alcohol Use Disorder Identification Test Final Score (AUDIT) 4   A score of 3 or more in women, and 4 or more in men indicates increased risk for alcohol abuse, EXCEPT if all of the points are from question 1   No  results found for any visits on 09/20/20.  Assessment & Plan    Routine Health Maintenance and Physical Exam  Exercise Activities and Dietary recommendations  Goals   None     Immunization History  Administered Date(s) Administered   Influenza,inj,Quad PF,6+ Mos 12/31/2015, 01/20/2018   Influenza-Unspecified 01/01/2015   Tdap 12/31/2015    Health Maintenance  Topic Date Due   COVID-19 Vaccine (1) Never done   Pneumococcal Vaccine 27-71 Years old (1 - PCV) Never done   Zoster Vaccines- Shingrix (1 of 2) Never done   INFLUENZA VACCINE  10/07/2020   COLONOSCOPY (Pts 45-38yrs Insurance coverage will need to be confirmed)  05/14/2022   TETANUS/TDAP  12/30/2025   Hepatitis C Screening  Completed   HIV Screening  Completed   HPV VACCINES  Aged Out    Discussed health benefits of physical activity, and encouraged him to engage in regular exercise appropriate for his age and condition.  1. Annual physical exam General health stable. Will check with pharmacy regarding shingles, pneumonia and COVID booster. Counseled regarding health maintenance.  2. Panic disorder with agoraphobia Well controlled with use of Trazodone each night and occasional use of Lorazepam prn panic  attacks. Recheck labs. - traZODone (DESYREL) 50 MG tablet; TAKE 1/2 TO 1 TABLET AT BEDTIME AS NEEDED FOR SLEEP  Dispense: 30 tablet; Refill: 6 - CBC with Differential/Platelet - TSH  3. Essential hypertension Fair control of BP. Needs Metoprolol refilled. No palpitations, chest pains or dyspnea. Recheck labs and follow up pending reports. - metoprolol succinate (TOPROL-XL) 50 MG 24 hr tablet; Take 1 tablet (50 mg total) by mouth daily. Take with or immediately following a meal.  Dispense: 90 tablet; Refill: 3 - CBC with Differential/Platelet - Comprehensive metabolic panel - Lipid panel - TSH  4. Obstructive apnea Continues to use CPAP each night and sleeping well. Machine is 61 years old.  5. Sleep  disturbance Trazodone still working well to help with sleep pattern. - traZODone (DESYREL) 50 MG tablet; TAKE 1/2 TO 1 TABLET AT BEDTIME AS NEEDED FOR SLEEP  Dispense: 30 tablet; Refill: 6  6. Meralgia paresthetica of right side Diagnosed by Dr. Melrose Nakayama (neurologist) 06-18-20. Gabapentin helps control discomfort of tingling and burning. Recheck labs. - CBC with Differential/Platelet - Comprehensive metabolic panel  7. Screening PSA (prostate specific antigen) Asymptomatic. Adopted and no known family history of prostate cancer. - Comprehensive metabolic panel - PSA   No follow-ups on file.     I, Donnarae Rae, PA-C, have reviewed all documentation for this visit. The documentation on 09/20/20 for the exam, diagnosis, procedures, and orders are all accurate and complete.    Vernie Murders, PA-C  [employer] [phone] (phone) [phone] (fax)  [place]

## 2020-09-21 LAB — LIPID PANEL
Chol/HDL Ratio: 2.6 ratio (ref 0.0–5.0)
Cholesterol, Total: 164 mg/dL (ref 100–199)
HDL: 63 mg/dL (ref >39)
LDL Chol Calc (NIH): 85 mg/dL (ref 0–99)
Triglycerides: 84 mg/dL (ref 0–149)
VLDL Cholesterol Cal: 16 mg/dL (ref 5–40)

## 2020-09-21 LAB — CBC WITH DIFFERENTIAL/PLATELET
Basophils Absolute: 0.1 10*3/uL (ref 0.0–0.2)
Basos: 1 %
EOS (ABSOLUTE): 0.2 10*3/uL (ref 0.0–0.4)
Eos: 3 %
Hematocrit: 42.2 % (ref 37.5–51.0)
Hemoglobin: 14.6 g/dL (ref 13.0–17.7)
Immature Grans (Abs): 0 10*3/uL (ref 0.0–0.1)
Immature Granulocytes: 0 %
Lymphocytes Absolute: 1.6 10*3/uL (ref 0.7–3.1)
Lymphs: 22 %
MCH: 34.3 pg — ABNORMAL HIGH (ref 26.6–33.0)
MCHC: 34.6 g/dL (ref 31.5–35.7)
MCV: 99 fL — ABNORMAL HIGH (ref 79–97)
Monocytes Absolute: 0.6 10*3/uL (ref 0.1–0.9)
Monocytes: 8 %
Neutrophils Absolute: 4.9 10*3/uL (ref 1.4–7.0)
Neutrophils: 66 %
Platelets: 141 10*3/uL — ABNORMAL LOW (ref 150–450)
RBC: 4.26 x10E6/uL (ref 4.14–5.80)
RDW: 12 % (ref 11.6–15.4)
WBC: 7.4 10*3/uL (ref 3.4–10.8)

## 2020-09-21 LAB — COMPREHENSIVE METABOLIC PANEL
ALT: 21 IU/L (ref 0–44)
AST: 17 IU/L (ref 0–40)
Albumin/Globulin Ratio: 1.6 (ref 1.2–2.2)
Albumin: 4.2 g/dL (ref 3.8–4.9)
Alkaline Phosphatase: 60 IU/L (ref 44–121)
BUN/Creatinine Ratio: 18 (ref 10–24)
BUN: 14 mg/dL (ref 8–27)
Bilirubin Total: 0.5 mg/dL (ref 0.0–1.2)
CO2: 22 mmol/L (ref 20–29)
Calcium: 9.2 mg/dL (ref 8.6–10.2)
Chloride: 104 mmol/L (ref 96–106)
Creatinine, Ser: 0.76 mg/dL (ref 0.76–1.27)
Globulin, Total: 2.7 g/dL (ref 1.5–4.5)
Glucose: 91 mg/dL (ref 65–99)
Potassium: 4.7 mmol/L (ref 3.5–5.2)
Sodium: 140 mmol/L (ref 134–144)
Total Protein: 6.9 g/dL (ref 6.0–8.5)
eGFR: 103 mL/min/{1.73_m2} (ref >59)

## 2020-09-21 LAB — TSH: TSH: 1.45 u[IU]/mL (ref 0.450–4.500)

## 2020-09-21 LAB — PSA: Prostate Specific Ag, Serum: 0.5 ng/mL (ref 0.0–4.0)

## 2020-10-11 ENCOUNTER — Other Ambulatory Visit: Payer: Self-pay | Admitting: Adult Health

## 2020-10-11 ENCOUNTER — Other Ambulatory Visit: Payer: Self-pay | Admitting: Family Medicine

## 2020-10-11 DIAGNOSIS — F4001 Agoraphobia with panic disorder: Secondary | ICD-10-CM

## 2020-10-11 NOTE — Telephone Encounter (Signed)
Requested medication (s) are due for refill today: yes  Requested medication (s) are on the active medication list: yes  Last refill:  08/22/20 #60  Future visit scheduled: no  Notes to clinic:  Please review for refill. Refill not delegated per protocol. Patient out of refills per pharmacy    Requested Prescriptions  Pending Prescriptions Disp Refills   LORazepam (ATIVAN) 0.5 MG tablet [Pharmacy Med Name: LORAZEPAM 0.5 MG TAB] 60 tablet     Sig: TAKE 1 TABLET BY MOUTH TWICE DAILY AS NEEDED.      Not Delegated - Psychiatry:  Anxiolytics/Hypnotics Failed - 10/11/2020  4:07 PM      Failed - This refill cannot be delegated      Failed - Urine Drug Screen completed in last 360 days      Passed - Valid encounter within last 6 months    Recent Outpatient Visits           3 weeks ago Annual physical exam   [place], PA-C   3 months ago Primary hypertension   [employer] [place], Kelby Aline, FNP   10 months ago Otalgia of both [place], [place], [place]   1 year ago Dysfunction of left eustachian tube   [place], Vickki Muff, PA-C   1 year ago Annual physical exam   [employer], Vickki Muff, [place]

## 2020-12-12 ENCOUNTER — Other Ambulatory Visit: Payer: Self-pay | Admitting: Family Medicine

## 2020-12-12 DIAGNOSIS — F4001 Agoraphobia with panic disorder: Secondary | ICD-10-CM

## 2020-12-12 NOTE — Telephone Encounter (Signed)
Requested medication (s) are due for refill today:yes  Requested medication (s) are on the active medication list:yes  Last refill: 10/14/20  #60  0 refills  Future visit scheduled  no  Notes to clinic  not delegated  Requested Prescriptions  Pending Prescriptions Disp Refills   LORazepam (ATIVAN) 0.5 MG tablet 60 tablet 0     Not Delegated - Psychiatry:  Anxiolytics/Hypnotics Failed - 12/12/2020  4:34 PM      Failed - This refill cannot be delegated      Failed - Urine Drug Screen completed in last 360 days      Passed - Valid encounter within last 6 months    Recent Outpatient Visits           2 months ago Annual physical exam   [place], PA-C   5 months ago Primary hypertension   [place], Kelby Aline, [place]   1 year ago Otalgia of both [place], [place], [place]   1 year ago Dysfunction of left eustachian tube   [place], Vickki Muff, PA-C   1 year ago Annual physical exam   [employer], Vickki Muff, [place]

## 2020-12-12 NOTE — Telephone Encounter (Signed)
Requested medication (s) are due for refill today - yes  Requested medication (s) are on the active medication list -yes  Future visit scheduled -no  Last refill: 10/14/20 #60  Notes to clinic: Request RF: non delegated Rx  Requested Prescriptions  Pending Prescriptions Disp Refills   LORazepam (ATIVAN) 0.5 MG tablet 60 tablet 0     Not Delegated - Psychiatry:  Anxiolytics/Hypnotics Failed - 12/12/2020 11:09 AM      Failed - This refill cannot be delegated      Failed - Urine Drug Screen completed in last 360 days      Passed - Valid encounter within last 6 months    Recent Outpatient Visits           2 months ago Annual physical exam   [place], PA-C   5 months ago Primary hypertension   [place], Kelby Aline, [place]   1 year ago Otalgia of both [place], [place], PA-C   1 year ago Dysfunction of left eustachian tube   [employer], Vickki Muff, PA-C   1 year ago Annual physical exam   [place], PA-C                 Requested Prescriptions  Pending Prescriptions Disp Refills   LORazepam (ATIVAN) 0.5 MG tablet 60 tablet 0     Not Delegated - Psychiatry:  Anxiolytics/Hypnotics Failed - 12/12/2020 11:09 AM      Failed - This refill cannot be delegated      Failed - Urine Drug Screen completed in last 360 days      Passed - Valid encounter within last 6 months    Recent Outpatient Visits           2 months ago Annual physical exam   [place], PA-C   5 months ago Primary hypertension   [employer] [place], Kelby Aline, [place]   1 year ago Otalgia of both [place], [place], [place]   1 year ago Dysfunction of left eustachian tube   [place], Vickki Muff, PA-C   1 year ago Annual physical exam   [employer], Vickki Muff, [place]

## 2020-12-12 NOTE — Telephone Encounter (Signed)
Patient called n about lorazepam, says not delegated, not sure why. Please call back

## 2020-12-12 NOTE — Telephone Encounter (Signed)
Medication Refill - Medication: LORazepam (ATIVAN) 0.5 MG tablet  Has the patient contacted their pharmacy? Yes.   (Agent: If no, request that the patient contact the pharmacy for the refill.) (Agent: If yes, when and what did the pharmacy advise?)  Preferred Pharmacy (with phone number or street name): [place], [place] [facility] [address] Has the patient been seen for an appointment in the last year OR does the patient have an upcoming appointment? Yes.    Agent: Please be advised that RX refills may take up to 3 business days. We ask that you follow-up with your pharmacy.

## 2020-12-13 ENCOUNTER — Other Ambulatory Visit: Payer: Self-pay | Admitting: Family Medicine

## 2020-12-13 DIAGNOSIS — F4001 Agoraphobia with panic disorder: Secondary | ICD-10-CM

## 2020-12-13 MED ORDER — LORAZEPAM 0.5 MG PO TABS: 0.5000 mg | ORAL_TABLET | Freq: Three times a day (TID) | ORAL | 0 refills | Status: DC | PRN

## 2020-12-13 NOTE — Telephone Encounter (Signed)
LOV: 09/20/2020   Thanks,   -Mickel Baas

## 2020-12-26 ENCOUNTER — Telehealth: Payer: Self-pay

## 2020-12-26 ENCOUNTER — Telehealth: Payer: BLUE CROSS/BLUE SHIELD | Admitting: Physician Assistant

## 2020-12-26 ENCOUNTER — Ambulatory Visit: Payer: Self-pay

## 2020-12-26 DIAGNOSIS — U071 COVID-19: Secondary | ICD-10-CM

## 2020-12-26 NOTE — Telephone Encounter (Signed)
Patient called and he says he was exposed to his girlfriend who tested positive for COVID last Wednesday. He says he started having symptoms 2 days ago with sniffles and scratchy throat. His first test was negative. Today he took a COVID test and it was positive. He denies SOB, no CP. He says he has chest congestion, cough of clear mucus and is taking Robitussin to keep from coughing as bad, so moderate-severe cough he says. He says he has no thermometer, has flu aches he says and chills. Other symptoms sinus congestion, fatigue. Advised no available appointments in the office, advised MyChart e-visit or MyChart Virtual Visit, care advice given, patient says he will do the e-visit, asked when should he do it, advised he could do it after I hang up, patient verbalized understanding.    Message from [name] sent at 12/26/2020  4:19 PM EDT  Patient is back to check on the status of a request that was sent to the office today. Pt is calling to request a script sent to the pharmacy for Paxlovid since he is tested positive for Covid today. Pt was exposed to [place] from his girlfriend.  Pt sx started on Tuesday with tickle in throat and head cold sx. Pt took test on Tuesday was negative. Pt was not feeling bad yesterday. Pt took test today and it was positive. Please advise [phone] Preferred Pharmacy [facility]    Reason for Disposition  [1] Continuous (nonstop) coughing interferes with work or school AND [2] no improvement using cough treatment per Care Advice  Answer Assessment - Initial Assessment Questions 1. COVID-19 DIAGNOSIS: "Who made your COVID-19 diagnosis?" "Was it confirmed by a positive lab test or self-test?" If not diagnosed by a doctor (or NP/PA), ask "Are there lots of cases (community spread) where you live?" Note: See public health department website, if unsure.     Home test 2. COVID-19 EXPOSURE: "Was there any known exposure to COVID before the symptoms began?" CDC  Definition of close contact: within 6 feet (2 meters) for a total of 15 minutes or more over a 24-hour period.      Yes, my girlfriend tested positive last Wednesday 3. ONSET: "When did the COVID-19 symptoms start?"      2 days ago sniffles, scratchy throat 4. WORST SYMPTOM: "What is your worst symptom?" (e.g., cough, fever, shortness of breath, muscle aches)     Cough productive clear mucus, chest congestion 5. COUGH: "Do you have a cough?" If Yes, ask: "How bad is the cough?"       Yes, moderate-severe 6. FEVER: "Do you have a fever?" If Yes, ask: "What is your temperature, how was it measured, and when did it start?"     No meter 7. RESPIRATORY STATUS: "Describe your breathing?" (e.g., shortness of breath, wheezing, unable to speak)      No 8. BETTER-SAME-WORSE: "Are you getting better, staying the same or getting worse compared to yesterday?"  If getting worse, ask, "In what way?"     Worse 9. HIGH RISK DISEASE: "Do you have any chronic medical problems?" (e.g., asthma, heart or lung disease, weak immune system, obesity, etc.)     Sleep apnea, HTN 10. VACCINE: "Have you had the COVID-19 vaccine?" If Yes, ask: "Which one, how many shots, when did you get it?"       Yes 1 shot, [place]  11. BOOSTER: "Have you received your COVID-19 booster?" If Yes, ask: "Which one and when did you get  it?"       1 booster Moderna 12. PREGNANCY: "Is there any chance you are pregnant?" "When was your last menstrual period?"       N/A 13. OTHER SYMPTOMS: "Do you have any other symptoms?"  (e.g., chills, fatigue, headache, loss of smell or taste, muscle pain, sore throat)       Sinus congestion, fatigue, body aches 14. O2 SATURATION MONITOR:  "Do you use an oxygen saturation monitor (pulse oximeter) at home?" If Yes, ask "What is your reading (oxygen level) today?" "What is your usual oxygen saturation reading?" (e.g., 95%)       No  Protocols used: Coronavirus (COVID-19) Diagnosed or Suspected-A-AH

## 2020-12-26 NOTE — Telephone Encounter (Signed)
Noted  

## 2020-12-26 NOTE — Telephone Encounter (Signed)
Gave phone number to [facility] pharmacy to be triaged for antiviral medication.

## 2020-12-26 NOTE — Progress Notes (Signed)
If you desire the FDA-emergency use antiviral medication options, we do require a video visit for that. To access a video visit please select the "Virtual Urgent Care Visit" option in the [employer].  Based on the information that you have shared in the e-Visit Questionnaire, we recommend that you convert this visit to a video visit in order for the provider to better assess what is going on.  The provider will be able to give you a more accurate diagnosis and treatment plan if we can more freely discuss your symptoms and with the addition of a virtual examination.   If you convert to a video visit, we will bill your insurance (similar to an office visit) and you will not be charged for this e-Visit. You will be able to stay at home and speak with the first available [facility] advanced practice provider. The link to do a video visit is in the drop down Menu tab of your Welcome screen in [place].

## 2020-12-26 NOTE — Telephone Encounter (Signed)
Copied from [place] [phone]. Topic: General - Other >> Dec 26, 2020 12:55 PM Leward Quan A wrote: Reason for CRM: Patient called in to inquire of any provider in the office about getting an Rx sent to the pharmacy for Paxlovid since he is tested positive for Covid and is struggling with the symptoms. Please call patient wityh a reply at Ph# [phone]

## 2020-12-27 ENCOUNTER — Telehealth: Payer: BLUE CROSS/BLUE SHIELD | Admitting: Physician Assistant

## 2020-12-27 DIAGNOSIS — U071 COVID-19: Secondary | ICD-10-CM

## 2020-12-27 MED ORDER — BENZONATATE 100 MG PO CAPS: 100.0000 mg | ORAL_CAPSULE | Freq: Three times a day (TID) | ORAL | 0 refills | Status: DC | PRN

## 2020-12-27 MED ORDER — MOLNUPIRAVIR EUA 200MG CAPSULE
4.0000 | ORAL_CAPSULE | Freq: Two times a day (BID) | ORAL | 0 refills | Status: AC
Start: 2020-12-27 — End: 2021-01-01

## 2020-12-27 MED ORDER — FLUTICASONE PROPIONATE 50 MCG/ACT NA SUSP: 2.0000 | Freq: Every day | NASAL | 0 refills | Status: DC

## 2020-12-27 NOTE — Progress Notes (Signed)
Virtual Visit Consent   Shaun Griffith, you are scheduled for a virtual visit with a Shaun Griffith provider today.     Just as with appointments in the office, your consent must be obtained to participate.  Your consent will be active for this visit and any virtual visit you may have with one of our providers in the next 365 days.     If you have a MyChart account, a copy of this consent can be sent to you electronically.  All virtual visits are billed to your insurance company just like a traditional visit in the office.    As this is a virtual visit, video technology does not allow for your provider to perform a traditional examination.  This may limit your provider's ability to fully assess your condition.  If your provider identifies any concerns that need to be evaluated in person or the need to arrange testing (such as labs, EKG, etc.), we will make arrangements to do so.     Although advances in technology are sophisticated, we cannot ensure that it will always work on either your end or our end.  If the connection with a video visit is poor, the visit may have to be switched to a telephone visit.  With either a video or telephone visit, we are not always able to ensure that we have a secure connection.     I need to obtain your verbal consent now.   Are you willing to proceed with your visit today?    [name] has provided verbal consent on 12/27/2020 for a virtual visit (video or telephone).   Mar Daring, PA-C   Date: 12/27/2020 9:28 AM   Virtual Visit via Video Note   I, Mar Daring, connected with  Shaun Griffith  ([id number], [birth year]) on 12/27/20 at  8:15 AM EDT by a video-enabled telemedicine application and verified that I am speaking with the correct person using two identifiers.  Location: Patient: Virtual Visit Location Patient: Home Provider: Virtual Visit Location Provider: Home Office   I discussed the limitations of evaluation and  management by telemedicine and the availability of in person appointments. The patient expressed understanding and agreed to proceed.    History of Present Illness: Shaun Griffith is a 60 y.o. who identifies as a male who was assigned male at birth, and is being seen today for Covid 52.  HPI: URI  This is a new problem. Episode onset: tested positive yesterday; Symptoms started Tuesday. The problem has been gradually worsening. There has been no fever. Associated symptoms include congestion, coughing (clear mucous when productive), headaches, rhinorrhea, sinus pain, sneezing and a sore throat. Pertinent negatives include no diarrhea, ear pain, nausea, plugged ear sensation or vomiting. Treatments tried: tylenol, robiutussin DM. The treatment provided mild relief.     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    Allergies:  Allergies  Allergen Reactions   Sulfadimethoxine Nausea Only    flu like symptoms   Medications:  Current Outpatient Medications:    benzonatate (TESSALON) 100 MG capsule, Take  1 capsule (100 mg total) by mouth 3 (three) times daily as needed., Disp: 30 capsule, Rfl: 0   fluticasone (FLONASE) 50 MCG/ACT nasal spray, Place 2 sprays into both nostrils daily., Disp: 16 g, Rfl: 0   molnupiravir EUA (LAGEVRIO) 200 mg CAPS capsule, Take 4 capsules (800 mg total) by mouth 2 (two) times daily for 5 days., Disp: 40 capsule, Rfl: 0   Azelastine-Fluticasone (DYMISTA) 137-50 MCG/ACT SUSP, One spray per nostril 2x daily., Disp: 23 g, Rfl: 0   b complex-C-folic acid 1 MG  capsule, Take 1 capsule by mouth daily., Disp: , Rfl:    gabapentin (NEURONTIN) 300 MG capsule, Take 300mg  in the morning, 300mg  in the afternoon, and 600mg  at night., Disp: 120 capsule, Rfl: 3   LORazepam (ATIVAN) 0.5 MG tablet, Take 1 tablet (0.5 mg total) by mouth every 8 (eight) hours as needed for anxiety., Disp: 60 tablet, Rfl: 0   metoprolol succinate (TOPROL-XL) 50 MG 24 hr tablet, Take 1 tablet (50 mg total) by mouth daily. Take with or immediately following a meal., Disp: 90 tablet, Rfl: 3   traZODone (DESYREL) 50 MG tablet, TAKE 1/2 TO 1 TABLET AT BEDTIME AS NEEDED FOR SLEEP, Disp: 30 tablet, Rfl: 6  Observations/Objective: Patient is well-developed, well-nourished in no acute distress.  Resting comfortably at home.  Head is normocephalic, atraumatic.  No labored breathing.  Speech is clear and coherent with logical content.  Patient is alert and oriented at baseline.    Assessment and Plan: 1. COVID-19 - molnupiravir EUA (LAGEVRIO) 200 mg CAPS capsule; Take 4 capsules (800 mg total) by mouth 2 (two) times daily for 5 days.  Dispense: 40 capsule; Refill: 0 - benzonatate (TESSALON) 100 MG capsule; Take 1 capsule (100 mg total) by mouth 3 (three) times daily as needed.  Dispense: 30 capsule; Refill: 0 - fluticasone (FLONASE) 50 MCG/ACT nasal spray; Place 2 sprays into both nostrils daily.  Dispense: 16 g; Refill: 0 - MyChart COVID-19 home monitoring program; Future  - Continue OTC symptomatic management of choice - Will send OTC vitamins and supplement information through AVS - Molnupiravir prescribed - Tessalon for cough, flonase for congestion - Patient enrolled in MyChart symptom monitoring - Push fluids - Rest as needed - Discussed return precautions and when to seek in-person evaluation, sent via AVS as well  Follow Up Instructions: I discussed the assessment and treatment plan with the patient. The patient was provided an opportunity to ask questions and all were  answered. The patient agreed with the plan and demonstrated an understanding of the instructions.  A copy of instructions were sent to the patient via MyChart unless otherwise noted below.    The patient was advised to call back or seek an in-person evaluation if the symptoms worsen or if the condition fails to improve as anticipated.  Time:  I spent 12 minutes with the patient via telehealth technology discussing the above problems/concerns.    Mar Daring, PA-C

## 2020-12-27 NOTE — Telephone Encounter (Signed)
Patient was given number to [place].

## 2020-12-27 NOTE — Patient Instructions (Signed)
Hello Shaun John "[name]",  You are being placed in the home monitoring program for COVID-19 (commonly known as Coronavirus).  This is because you are suspected to have the virus or are known to have the virus.  If you are unsure which group you fall into call your clinic.    As part of this program, you'll answer a daily questionnaire in the MyChart mobile app. You'll receive a notification through the MyChart app when the questionnaire is available. When you log in to MyChart, you'll see the tasks in your To Do activity.       Clinicians will see any answers that are concerning and take appropriate steps.  If at any point you are having a medical emergency, call 911.  If otherwise concerned call your clinic instead of coming into the clinic or hospital.  To keep from spreading the disease you should: Stay home and limit contact with other people as much as possible.  Wash your hands frequently. Cover your coughs and sneezes with a tissue, and throw used tissues in the trash.   Clean and disinfect frequently touched surfaces and objects.    Take care of yourself by: Staying home Resting Drinking fluids Take fever-reducing medications (Tylenol/Acetaminophen and Ibuprofen)  For more information on the disease go to the Centers for Disease Control and Prevention website     You are being prescribed MOLNUPIRAVIR for COVID-19 infection.   Please call the pharmacy or go through the drive through vs going inside if you are picking up the mediation yourself to prevent further spread. If prescribed to a [facility] affiliated pharmacy, a pharmacist will bring the medication out to your car.   ADMINISTRATION INSTRUCTIONS: Take with or without food. Swallow the tablets whole. Don't chew, crush, or break the medications because it might not work as well  For each dose of the medication, you should be taking FOUR tablets at one time, TWICE a day   Finish your full five-day course of Molnupiravir even  if you feel better before you're done. Stopping this medication too early can make it less effective to prevent severe illness related to [place].    Molnupiravir is prescribed for YOU ONLY. Don't share it with others, even if they have similar symptoms as you. This medication might not be right for everyone.   Make sure to take steps to protect yourself and others while you're taking this medication in order to get well soon and to prevent others from getting sick with COVID-19.   **If you are of childbearing potential (any gender) - it is advised to not get pregnant while taking this medication and recommended that condoms are used for male partners the next 3 months after taking the medication out of extreme caution    COMMON SIDE EFFECTS: Diarrhea Nausea  Dizziness    If your COVID-19 symptoms get worse, get medical help right away. Call 911 if you experience symptoms such as worsening cough, trouble breathing, chest pain that doesn't go away, confusion, a hard time staying awake, and pale or blue-colored skin. This medication won't prevent all COVID-19 cases from getting worse.   Can take to lessen severity: Vit C 500mg  twice daily Quercertin 250-500mg  twice daily Zinc 75-100mg  daily Melatonin 3-6 mg at bedtime Vit D3 1000-2000 IU daily Aspirin 81 mg daily with food Optional: Famotidine 20mg  daily Also can add tylenol/ibuprofen as needed for fevers and body aches May add Mucinex or Mucinex DM as needed for cough/congestion  10 Things You Can  Do to Manage Your COVID-19 Symptoms at Home If you have possible or confirmed COVID-19 Stay home except to get medical care. Monitor your symptoms carefully. If your symptoms get worse, call your healthcare provider immediately. Get rest and stay hydrated. If you have a medical appointment, call the healthcare provider ahead of time and tell them that you have or may have COVID-19. For medical emergencies, call 911 and notify the  dispatch personnel that you have or may have COVID-19. Cover your cough and sneezes with a tissue or use the inside of your elbow. Wash your hands often with soap and water for at least 20 seconds or clean your hands with an alcohol-based hand sanitizer that contains at least 60% alcohol. As much as possible, stay in a specific room and away from other people in your home. Also, you should use a separate bathroom, if available. If you need to be around other people in or outside of the home, wear a mask. Avoid sharing personal items with other people in your household, like dishes, towels, and bedding. Clean all surfaces that are touched often, like counters, tabletops, and doorknobs. Use household cleaning sprays or wipes according to the label instructions. [email or web address] 09/22/2019 This information is not intended to replace advice given to you by your health care provider. Make sure you discuss any questions you have with your health care provider. Document Revised: 07/11/2020 Document Reviewed: 07/11/2020 Elsevier Patient Education  [place].

## 2021-02-03 ENCOUNTER — Other Ambulatory Visit: Payer: Self-pay | Admitting: Family Medicine

## 2021-02-03 DIAGNOSIS — F4001 Agoraphobia with panic disorder: Secondary | ICD-10-CM

## 2021-02-04 NOTE — Telephone Encounter (Signed)
Requested medication (s) are due for refill today: yes  Requested medication (s) are on the active medication list: yes  Last refill:  12/13/20 #60  Future visit scheduled: no  Notes to clinic:  Please review for refill. Refill not delegated per protocol.     Requested Prescriptions  Pending Prescriptions Disp Refills   LORazepam (ATIVAN) 0.5 MG tablet [Pharmacy Med Name: LORAZEPAM 0.5 MG TAB] 60 tablet     Sig: TAKE 1 TABLET BY MOUTH EVERY 8 HOURS AS NEEDED FOR ANXIETY     Not Delegated - Psychiatry:  Anxiolytics/Hypnotics Failed - 02/03/2021  2:35 PM      Failed - This refill cannot be delegated      Failed - Urine Drug Screen completed in last 360 days      Passed - Valid encounter within last 6 months    Recent Outpatient Visits           4 months ago Annual physical exam   [place], PA-C   7 months ago Primary hypertension   [place], Kelby Aline, [place]   1 year ago Otalgia of both [place], [place], [place]   1 year ago Dysfunction of left eustachian tube   [place], Vickki Muff, PA-C   1 year ago Annual physical exam   [employer], Vickki Muff, [place]

## 2021-02-05 ENCOUNTER — Other Ambulatory Visit: Payer: Self-pay | Admitting: Family Medicine

## 2021-02-05 DIAGNOSIS — F4001 Agoraphobia with panic disorder: Secondary | ICD-10-CM

## 2021-02-05 NOTE — Telephone Encounter (Signed)
LOV: 09/20/2020   Thanks,   -Mickel Baas

## 2021-02-06 NOTE — Telephone Encounter (Signed)
LOV:  09/20/2020  NOV: None   Last Refill: 12/13/2020 #60 0 Refills   Thanks,   -Mickel Baas

## 2021-03-04 ENCOUNTER — Encounter: Payer: Self-pay | Admitting: Family Medicine

## 2021-03-04 ENCOUNTER — Ambulatory Visit (INDEPENDENT_AMBULATORY_CARE_PROVIDER_SITE_OTHER): Payer: 59 | Admitting: Family Medicine

## 2021-03-04 ENCOUNTER — Other Ambulatory Visit: Payer: Self-pay

## 2021-03-04 VITALS — BP 152/93 | HR 93 | Resp 16 | Wt 275.0 lb

## 2021-03-04 DIAGNOSIS — K314 Gastric diverticulum: Secondary | ICD-10-CM | POA: Diagnosis not present

## 2021-03-04 DIAGNOSIS — G8929 Other chronic pain: Secondary | ICD-10-CM | POA: Insufficient documentation

## 2021-03-04 DIAGNOSIS — E6609 Other obesity due to excess calories: Secondary | ICD-10-CM | POA: Insufficient documentation

## 2021-03-04 DIAGNOSIS — R1032 Left lower quadrant pain: Secondary | ICD-10-CM | POA: Diagnosis not present

## 2021-03-04 DIAGNOSIS — Z6834 Body mass index (BMI) 34.0-34.9, adult: Secondary | ICD-10-CM

## 2021-03-04 MED ORDER — SUCRALFATE 1 G PO TABS: 1.0000 g | ORAL_TABLET | Freq: Three times a day (TID) | ORAL | 0 refills | Status: DC

## 2021-03-04 MED ORDER — METRONIDAZOLE 500 MG PO TABS
500.0000 mg | ORAL_TABLET | Freq: Three times a day (TID) | ORAL | 0 refills | Status: AC
Start: 2021-03-04 — End: 2021-03-11

## 2021-03-04 NOTE — Assessment & Plan Note (Signed)
Seen on colonoscopy from 2019; not treated Ongoing pain for months- wax/wanes in natures Referral placed for [place] and then CT if needed

## 2021-03-04 NOTE — Assessment & Plan Note (Signed)
Change in bowel pattern; however, not consistent Wax/waning pattern Purposeful weight loss

## 2021-03-04 NOTE — Progress Notes (Signed)
Established patient visit   Patient: Shaun Griffith   DOB: 1960/09/11   60 y.o. Male  MRN: [id number] Visit Date: 03/04/2021  Today's healthcare provider: [name], FNP   Chief Complaint  Patient presents with   Abdominal Pain   Subjective    Abdominal Pain This is a new problem. The current episode started more than 1 month ago. The onset quality is sudden. The problem occurs intermittently. The pain is located in the LLQ. The quality of the pain is aching, burning, a sensation of fullness, sharp and tearing. The abdominal pain does not radiate. Associated symptoms include belching, constipation, diarrhea, flatus, headaches and nausea. Pertinent negatives include no anorexia, arthralgias, dysuria, fever, frequency, hematochezia, hematuria, melena, myalgias, vomiting or weight loss. Associated symptoms comments: Decreased appetite. The pain is aggravated by certain positions. Relieved by: laying down. He has tried acetaminophen for the symptoms. The treatment provided no relief. His past medical history is significant for GERD. There is no history of abdominal surgery, colon cancer, Crohn's disease, gallstones, irritable bowel syndrome, pancreatitis or ulcerative colitis.     Medications: Outpatient Medications Prior to Visit  Medication Sig   Azelastine-Fluticasone (DYMISTA) 137-50 MCG/ACT SUSP One spray per nostril 2x daily.   b complex-C-folic acid 1 MG capsule Take 1 capsule by mouth daily.   benzonatate (TESSALON) 100 MG capsule Take 1 capsule (100 mg total) by mouth 3 (three) times daily as needed.   fluticasone (FLONASE) 50 MCG/ACT nasal spray Place 2 sprays into both nostrils daily.   gabapentin (NEURONTIN) 300 MG capsule Take 300mg  in the morning, 300mg  in the afternoon, and 600mg  at night.   LORazepam (ATIVAN) 0.5 MG tablet Take 1 tablet (0.5 mg total) by mouth daily as needed for anxiety.   traZODone (DESYREL) 50 MG tablet TAKE 1/2 TO 1 TABLET AT BEDTIME AS NEEDED  FOR SLEEP   metoprolol succinate (TOPROL-XL) 50 MG 24 hr tablet Take 1 tablet (50 mg total) by mouth daily. Take with or immediately following a meal.   No facility-administered medications prior to visit.    Review of Systems  Constitutional:  Negative for fever and weight loss.  Gastrointestinal:  Positive for abdominal pain, constipation, diarrhea, flatus and nausea. Negative for anorexia, hematochezia, melena and vomiting.  Genitourinary:  Negative for dysuria, frequency and hematuria.  Musculoskeletal:  Negative for arthralgias and myalgias.  Neurological:  Positive for headaches.      Objective    BP (!) 152/93    Pulse 93    Resp 16    Wt 275 lb (124.7 kg)    SpO2 95%    BMI 34.37 kg/m    Physical Exam Vitals and nursing note reviewed.  Constitutional:      General: He is not in acute distress.    Appearance: Normal appearance. He is obese. He is not ill-appearing, toxic-appearing or diaphoretic.  HENT:     Head: Normocephalic and atraumatic.  Eyes:     Pupils: Pupils are equal, round, and reactive to light.  Cardiovascular:     Rate and Rhythm: Normal rate and regular rhythm.     Pulses: Normal pulses.     Heart sounds: Normal heart sounds.  Pulmonary:     Effort: Pulmonary effort is normal.     Breath sounds: Normal breath sounds.  Abdominal:     General: Bowel sounds are normal. There is no distension or abdominal bruit. There are no signs of injury.     Palpations:  There is no shifting dullness or fluid wave.     Tenderness: There is no abdominal tenderness.     Hernia: A hernia is present. Hernia is present in the right inguinal area.       Comments: Hx of R inguinal hernia  Outlined area of abdominal pain- not present today  Musculoskeletal:        General: Normal range of motion.     Cervical back: Normal range of motion.  Skin:    General: Skin is warm and dry.     Capillary Refill: Capillary refill takes less than 2 seconds.  Neurological:      General: No focal deficit present.     Mental Status: He is alert and oriented to person, place, and time. Mental status is at baseline.     No results found for any visits on 03/04/21.  Assessment & Plan     Problem List Items Addressed This Visit       Digestive   Gastric diverticulum    Seen on colonoscopy from 2019; not treated Ongoing pain for months- wax/wanes in natures Referral placed for [place] and then CT if needed      Relevant Orders   CT ABDOMEN W CONTRAST   C-reactive protein   CBC with Differential/Platelet   Comprehensive metabolic panel     Other   Chronic LLQ pain - Primary    Change in bowel pattern; however, not consistent Wax/waning pattern Purposeful weight loss      Relevant Medications   sucralfate (CARAFATE) 1 g tablet   metroNIDAZOLE (FLAGYL) 500 MG tablet   Other Relevant Orders   CT ABDOMEN W CONTRAST   US Abdomen Complete   Class 1 obesity due to excess calories with serious comorbidity and body mass index (BMI) of 34.0 to 34.9 in adult    BMI 34.37 on exam Discussed importance of healthy weight management Discussed diet and exercise         Return if symptoms worsen or fail to improve.     Vonna Kotyk, FNP, have reviewed all documentation for this visit. The documentation on 03/04/21 for the exam, diagnosis, procedures, and orders are all accurate and complete.    [name], [place] [phone] (phone) [phone] (fax)  [place]

## 2021-03-04 NOTE — Assessment & Plan Note (Signed)
BMI 34.37 on exam Discussed importance of healthy weight management Discussed diet and exercise

## 2021-03-05 LAB — CBC WITH DIFFERENTIAL/PLATELET
Basophils Absolute: 0.1 10*3/uL (ref 0.0–0.2)
Basos: 1 %
EOS (ABSOLUTE): 0.4 10*3/uL (ref 0.0–0.4)
Eos: 5 %
Hematocrit: 49.5 % (ref 37.5–51.0)
Hemoglobin: 16.5 g/dL (ref 13.0–17.7)
Immature Grans (Abs): 0 10*3/uL (ref 0.0–0.1)
Immature Granulocytes: 0 %
Lymphocytes Absolute: 2.3 10*3/uL (ref 0.7–3.1)
Lymphs: 32 %
MCH: 33.1 pg — ABNORMAL HIGH (ref 26.6–33.0)
MCHC: 33.3 g/dL (ref 31.5–35.7)
MCV: 99 fL — ABNORMAL HIGH (ref 79–97)
Monocytes Absolute: 0.5 10*3/uL (ref 0.1–0.9)
Monocytes: 7 %
Neutrophils Absolute: 3.8 10*3/uL (ref 1.4–7.0)
Neutrophils: 55 %
Platelets: 157 10*3/uL (ref 150–450)
RBC: 4.99 x10E6/uL (ref 4.14–5.80)
RDW: 12.1 % (ref 11.6–15.4)
WBC: 7 10*3/uL (ref 3.4–10.8)

## 2021-03-05 LAB — COMPREHENSIVE METABOLIC PANEL
ALT: 19 IU/L (ref 0–44)
AST: 21 IU/L (ref 0–40)
Albumin/Globulin Ratio: 1.7 (ref 1.2–2.2)
Albumin: 4.5 g/dL (ref 3.8–4.8)
Alkaline Phosphatase: 55 IU/L (ref 44–121)
BUN/Creatinine Ratio: 16 (ref 10–24)
BUN: 16 mg/dL (ref 8–27)
Bilirubin Total: 0.5 mg/dL (ref 0.0–1.2)
CO2: 23 mmol/L (ref 20–29)
Calcium: 9.4 mg/dL (ref 8.6–10.2)
Chloride: 102 mmol/L (ref 96–106)
Creatinine, Ser: 0.99 mg/dL (ref 0.76–1.27)
Globulin, Total: 2.6 g/dL (ref 1.5–4.5)
Glucose: 94 mg/dL (ref 70–99)
Potassium: 4.1 mmol/L (ref 3.5–5.2)
Sodium: 143 mmol/L (ref 134–144)
Total Protein: 7.1 g/dL (ref 6.0–8.5)
eGFR: 87 mL/min/{1.73_m2} (ref >59)

## 2021-03-05 LAB — C-REACTIVE PROTEIN: CRP: 2 mg/L (ref 0–10)

## 2021-03-18 ENCOUNTER — Encounter: Payer: Self-pay | Admitting: Family Medicine

## 2021-03-18 ENCOUNTER — Other Ambulatory Visit: Payer: Self-pay | Admitting: Family Medicine

## 2021-03-19 ENCOUNTER — Ambulatory Visit: Admission: RE | Admit: 2021-03-19 | Payer: Self-pay | Source: Ambulatory Visit

## 2021-03-19 NOTE — Telephone Encounter (Signed)
Patient returned call to office.  He did not get message of cancelled [place] and presented to [place] this morning.  He does not know why it got cancelled but has been informed that a CT has been scheduled.  Patient would like clarification

## 2021-03-19 NOTE — Telephone Encounter (Signed)
Called patient LMTCB. 

## 2021-03-25 ENCOUNTER — Telehealth: Payer: Self-pay

## 2021-03-25 NOTE — Telephone Encounter (Signed)
Patient requested call back when available, he had a CT scan ordered on 03/04/21 and states that no one has reached out to him to schedule. Can you please look into this? KW

## 2021-03-26 ENCOUNTER — Other Ambulatory Visit: Payer: Self-pay

## 2021-03-26 DIAGNOSIS — R1032 Left lower quadrant pain: Secondary | ICD-10-CM

## 2021-03-26 DIAGNOSIS — K314 Gastric diverticulum: Secondary | ICD-10-CM

## 2021-03-26 DIAGNOSIS — G8929 Other chronic pain: Secondary | ICD-10-CM

## 2021-03-26 NOTE — Telephone Encounter (Signed)
Putting in new order please sign off when you see it. KW

## 2021-03-27 ENCOUNTER — Other Ambulatory Visit: Payer: Self-pay | Admitting: Family Medicine

## 2021-03-27 DIAGNOSIS — R1084 Generalized abdominal pain: Secondary | ICD-10-CM

## 2021-03-27 MED ORDER — MELOXICAM 15 MG PO TABS: 15.0000 mg | ORAL_TABLET | Freq: Every day | ORAL | 0 refills | Status: DC

## 2021-03-28 ENCOUNTER — Other Ambulatory Visit: Payer: Self-pay | Admitting: Family Medicine

## 2021-03-28 DIAGNOSIS — F4001 Agoraphobia with panic disorder: Secondary | ICD-10-CM

## 2021-03-28 NOTE — Telephone Encounter (Signed)
Requested medication (s) are due for refill today - yes  Requested medication (s) are on the active medication list -yes  Future visit scheduled -no  Last refill: 02/06/21 #60  Notes to clinic: Request RF: non delegated Rx  Requested Prescriptions  Pending Prescriptions Disp Refills   LORazepam (ATIVAN) 0.5 MG tablet [Pharmacy Med Name: LORAZEPAM 0.5 MG TAB] 60 tablet     Sig: TAKE 1 TABLET BY MOUTH DAILY AS NEEDED FOR ANXIETY.     Not Delegated - Psychiatry:  Anxiolytics/Hypnotics Failed - 03/28/2021 11:21 AM      Failed - This refill cannot be delegated      Failed - Urine Drug Screen completed in last 360 days      Passed - Valid encounter within last 6 months    Recent Outpatient Visits           3 weeks ago Chronic LLQ pain   [facility] [name], FNP   6 months ago Annual physical exam   [employer], Vickki Muff, PA-C   9 months ago Primary hypertension   [place], Kelby Aline, [place]   1 year ago Otalgia of both [place], [place], [place]   1 year ago Dysfunction of left eustachian tube   [place], PA-C                 Requested Prescriptions  Pending Prescriptions Disp Refills   LORazepam (ATIVAN) 0.5 MG tablet [Pharmacy Med Name: LORAZEPAM 0.5 MG TAB] 60 tablet     Sig: TAKE 1 TABLET BY MOUTH DAILY AS NEEDED FOR ANXIETY.     Not Delegated - Psychiatry:  Anxiolytics/Hypnotics Failed - 03/28/2021 11:21 AM      Failed - This refill cannot be delegated      Failed - Urine Drug Screen completed in last 360 days      Passed - Valid encounter within last 6 months    Recent Outpatient Visits           3 weeks ago Chronic LLQ pain   [facility] [name], FNP   6 months ago Annual physical exam   [place], PA-C   9 months ago Primary hypertension   [employer] [place], Kelby Aline, [place]   1 year ago Otalgia of both [place], [place], [place]   1 year ago Dysfunction of left eustachian tube   [place], Vickki Muff, [place]

## 2021-04-01 ENCOUNTER — Telehealth: Payer: Self-pay

## 2021-04-01 NOTE — Telephone Encounter (Signed)
Is this something that gets handled through referrals? I do not do pre-auth for CT

## 2021-04-01 NOTE — Telephone Encounter (Signed)
Copied from [place] [id number]. Topic: General - Other >> Apr 01, 2021  8:58 AM Tessa Lerner A wrote: Reason for CRM: Threasa Beards with [facility] has called requesting completion of the Prior Authorization of the patient's CT Abdomin   The patient has [employer]   Please contact further when possible

## 2021-04-04 ENCOUNTER — Other Ambulatory Visit: Payer: Self-pay

## 2021-04-04 ENCOUNTER — Ambulatory Visit
Admission: RE | Admit: 2021-04-04 | Discharge: 2021-04-04 | Disposition: A | Discharge status: Home / Self Care | Payer: 59 | Source: Ambulatory Visit | Attending: Family Medicine | Admitting: Family Medicine

## 2021-04-04 DIAGNOSIS — K314 Gastric diverticulum: Secondary | ICD-10-CM | POA: Diagnosis present

## 2021-04-04 DIAGNOSIS — G8929 Other chronic pain: Secondary | ICD-10-CM | POA: Insufficient documentation

## 2021-04-04 DIAGNOSIS — R1032 Left lower quadrant pain: Secondary | ICD-10-CM | POA: Insufficient documentation

## 2021-04-04 MED ORDER — IOHEXOL 300 MG/ML  SOLN
100.0000 mL | Freq: Once | INTRAMUSCULAR | Status: AC | PRN
  Administered 2021-04-04: 100 mL via INTRAVENOUS

## 2021-04-08 ENCOUNTER — Other Ambulatory Visit: Payer: Self-pay | Admitting: Family Medicine

## 2021-04-08 ENCOUNTER — Telehealth: Payer: Self-pay

## 2021-04-08 DIAGNOSIS — K5792 Diverticulitis of intestine, part unspecified, without perforation or abscess without bleeding: Secondary | ICD-10-CM

## 2021-04-08 DIAGNOSIS — K209 Esophagitis, unspecified without bleeding: Secondary | ICD-10-CM | POA: Insufficient documentation

## 2021-04-08 DIAGNOSIS — M87052 Idiopathic aseptic necrosis of left femur: Secondary | ICD-10-CM | POA: Insufficient documentation

## 2021-04-08 NOTE — Telephone Encounter (Signed)
Scheduled for 04/09/2021

## 2021-04-08 NOTE — Telephone Encounter (Signed)
Copied from [place] [phone]. Topic: General - Inquiry >> Apr 08, 2021 10:38 AM Parke Poisson wrote: Reason for CRM: Pt would like someone to call him to discuss CT results and the reason he has been referred to ortho

## 2021-04-09 ENCOUNTER — Ambulatory Visit (INDEPENDENT_AMBULATORY_CARE_PROVIDER_SITE_OTHER): Payer: 59 | Admitting: Gastroenterology

## 2021-04-09 ENCOUNTER — Encounter: Payer: Self-pay | Admitting: Gastroenterology

## 2021-04-09 ENCOUNTER — Other Ambulatory Visit: Payer: Self-pay

## 2021-04-09 VITALS — BP 164/104 | HR 81 | Temp 98.4°F | Ht 75.0 in | Wt 280.0 lb

## 2021-04-09 DIAGNOSIS — K209 Esophagitis, unspecified without bleeding: Secondary | ICD-10-CM

## 2021-04-09 DIAGNOSIS — R9389 Abnormal findings on diagnostic imaging of other specified body structures: Secondary | ICD-10-CM | POA: Diagnosis not present

## 2021-04-09 DIAGNOSIS — K5900 Constipation, unspecified: Secondary | ICD-10-CM

## 2021-04-09 MED ORDER — OMEPRAZOLE 40 MG PO CPDR: 40.0000 mg | DELAYED_RELEASE_CAPSULE | Freq: Two times a day (BID) | ORAL | 3 refills | Status: DC

## 2021-04-09 MED ORDER — NA SULFATE-K SULFATE-MG SULF 17.5-3.13-1.6 GM/177ML PO SOLN: 354.0000 mL | Freq: Once | ORAL | 0 refills | Status: DC

## 2021-04-09 NOTE — Addendum Note (Signed)
Addended by: Wayna Chalet on: 04/09/2021 04:38 PM   Modules accepted: Orders

## 2021-04-09 NOTE — Progress Notes (Signed)
Shaun Bellows MD, MRCP(U.K) [address]  [place]  [place], [place] [address]  Main: [phone]  Fax: [phone]   Gastroenterology Consultation  Referring Provider:     [name], FNP Primary Care Physician:  [name], FNP Primary Gastroenterologist:  Dr. Jonathon Griffith  Reason for Consultation:     Diverticulitis        HPI:   Shaun Griffith is a 61 y.o. y/o male referred for consultation & management  by  [name], FNP.     Last colonoscopy was back in March 2019 That showed 2 tiny polyps 3 to 4 mm in size in the ascending colon and diverticulosis of the sigmoid colon.  CT scan of the abdomen on 04/06/2021 showed wall thickening of the esophagus with retained fluid contrast in the esophagus suggestive of esophagitis however underlying mass cannot be excluded colonic diverticulosis and moderate stool from throughout the colon mild avascular necrosis left femoral head   He states that he has had left-sided abdominal pain for a few months.  On and off through the day.  Worse before a bowel movement.  He denies any constipation.  He states he has a bowel movement daily.  Denies any heartburn.  Denies any dysphagia.  Denies any weight loss.  Denies any reflux-like symptoms.  Past Medical History:  Diagnosis Date   Anxiety    Depression    Hypertension     Past Surgical History:  Procedure Laterality Date   BACK SURGERY  1990   HNP L5   COLONOSCOPY     COLONOSCOPY WITH PROPOFOL N/A 05/13/2017   Procedure: COLONOSCOPY WITH PROPOFOL;  Surgeon: Shaun Bellows, MD;  Location: [facility] ENDOSCOPY;  Service: Gastroenterology;  Laterality: N/A;   HERNIA REPAIR  1984   right groin   KNEE SURGERY Left 1992    Prior to Admission medications   Medication Sig Start Date End Date Taking? Authorizing Provider  Azelastine-Fluticasone (DYMISTA) 137-50 MCG/ACT SUSP One spray per nostril 2x daily. 11/17/19   Trinna Post, PA-C  b complex-C-folic acid 1 MG capsule Take 1  capsule by mouth daily.    [provider]  benzonatate (TESSALON) 100 MG capsule Take 1 capsule (100 mg total) by mouth 3 (three) times daily as needed. 12/27/20   Mar Daring, PA-C  fluticasone (FLONASE) 50 MCG/ACT nasal spray Place 2 sprays into both nostrils daily. 12/27/20   Mar Daring, PA-C  gabapentin (NEURONTIN) 300 MG capsule Take 300mg  in the morning, 300mg  in the afternoon, and 600mg  at night. 09/20/20   Chrismon, Simona Huh E, PA-C  LORazepam (ATIVAN) 0.5 MG tablet TAKE 1 TABLET BY MOUTH DAILY AS NEEDED FOR ANXIETY. 03/31/21   [name], FNP  meloxicam (MOBIC) 15 MG tablet Take 1 tablet (15 mg total) by mouth daily. 03/27/21   [name], FNP  metoprolol succinate (TOPROL-XL) 50 MG 24 hr tablet Take 1 tablet (50 mg total) by mouth daily. Take with or immediately following a meal. 09/20/20 12/19/20  Chrismon, Vickki Muff, PA-C  sucralfate (CARAFATE) 1 g tablet Take 1 tablet (1 g total) by mouth 4 (four) times daily -  with meals and at bedtime. 03/04/21   [name], FNP  traZODone (DESYREL) 50 MG tablet TAKE 1/2 TO 1 TABLET AT BEDTIME AS NEEDED FOR SLEEP 09/20/20   Chrismon, Vickki Muff, PA-C    Family History  Adopted: Yes  Problem Relation Age of Onset   Diabetes Mother  Depression Mother    Cancer Father        skin and blood     Social History   Tobacco Use   Smoking status: Former    Types: Cigarettes   Smokeless tobacco: Never  Vaping Use   Vaping Use: Never used  Substance Use Topics   Alcohol use: Yes    Alcohol/week: 12.0 standard drinks    Types: 12 Cans of beer per week    Comment: Moderate alcohol use, drinks beer 3-4 days a week; drinks 8-10 beers   Drug use: No    Allergies as of 04/09/2021 - Review Complete 04/09/2021  Allergen Reaction Noted   Sulfadimethoxine Nausea Only 10/22/2014    Review of Systems:    All systems reviewed and negative except where noted in HPI.   Physical Exam:  BP (!) 164/104    Pulse 81     Temp 98.4 F (36.9 C) (Oral)    Ht 6\' 3"  (1.905 m)    Wt 280 lb (127 kg)    BMI 35.00 kg/m  No LMP for male patient. Psych:  Alert and cooperative. Normal mood and affect. General:   Alert,  Well-developed, well-nourished, pleasant and cooperative in NAD Head:  Normocephalic and atraumatic. Eyes:  Sclera clear, no icterus.   Conjunctiva pink. Ears:  Normal auditory acuity. Lungs:  Respirations even and unlabored.  Clear throughout to auscultation.   No wheezes, crackles, or rhonchi. No acute distress. Heart:  Regular rate and rhythm; no murmurs, clicks, rubs, or gallops. Abdomen:  Normal bowel sounds.  No bruits.  Soft, non-tender and non-distended without masses, hepatosplenomegaly or hernias noted.  No guarding or rebound tenderness.   Neurologic:  Alert and oriented x3;  grossly normal neurologically. Psych:  Alert and cooperative. Normal mood and affect.  Imaging Studies: CT CHEST ABDOMEN PELVIS W CONTRAST  Result Date: 04/06/2021 CLINICAL DATA:  Chronic abdominal pain. EXAM: CT CHEST, ABDOMEN, AND PELVIS WITH CONTRAST TECHNIQUE: Multidetector CT imaging of the chest, abdomen and pelvis was performed following the standard protocol during bolus administration of intravenous contrast. RADIATION DOSE REDUCTION: This exam was performed according to the departmental dose-optimization program which includes automated exposure control, adjustment of the mA and/or kV according to patient size and/or use of iterative reconstruction technique. CONTRAST:  159mL OMNIPAQUE IOHEXOL 300 MG/ML  SOLN COMPARISON:  None. FINDINGS: CT CHEST FINDINGS Cardiovascular: Normal caliber thoracic aorta. No central pulmonary embolus on this nondedicated study. Normal size heart. No significant pericardial effusion/thickening. Mediastinum/Nodes: No discrete thyroid nodule. No pathologically enlarged mediastinal, hilar or axillary lymph nodes. Symmetric wall thickening of the esophagus with retained/refluxed contrast in the  esophagus. Central airways are patent. Lungs/Pleura: Hypoventilatory change in the dependent lungs. No suspicious pulmonary nodules or masses. No pleural effusion. No pneumothorax. Musculoskeletal: Multilevel degenerative changes spine. No aggressive lytic or blastic lesion of bone. CT ABDOMEN PELVIS FINDINGS Hepatobiliary: No suspicious hepatic lesion. Gallbladder is unremarkable. No biliary ductal dilation. Pancreas: No pancreatic ductal dilation or evidence of acute inflammation. Spleen: Within normal limits. Adrenals/Urinary Tract: Bilateral adrenal glands appear normal. No hydronephrosis. Symmetric enhancement and excretion of contrast from the bilateral kidneys. No solid enhancing renal mass. Urinary bladder is unremarkable for degree of distension. Stomach/Bowel: Radiopaque enteric contrast material traverses the rectum. Stomach is minimally distended. No pathologic dilation of large or small bowel. The appendix and terminal ileum appear normal. Moderate volume of formed stool throughout the colon. Colonic diverticulosis without findings of acute diverticulitis. No suspicious colonic wall thickening or  mass like lesions identified. Vascular/Lymphatic: No abdominal aortic aneurysm. No pathologically enlarged abdominal or pelvic lymph nodes. Reproductive: Prostate is unremarkable. Other: No significant abdominopelvic free fluid. Musculoskeletal: Multilevel degenerative changes spine. 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 Degenerative change of the bilateral hips. IMPRESSION: 1. Symmetric wall thickening of the esophagus with retained/refluxed contrast in the esophagus, suggestive of esophagitis however underlying mass like lesion not excluded consider further evaluation with endoscopy. 2. Colonic diverticulosis without findings of acute diverticulitis. 3. Moderate volume of formed stool throughout the colon. 4. 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  Electronically Signed   By: Dahlia Bailiff M.D.   On: 04/06/2021 12:14    Assessment and Plan:   LADISLAV CASELLI is a 61 y.o. y/o male has been referred for gastric diverticulum.The patient does not have a gastric diverticulum seen anywhere previously. He has diverticulum in the colon which does not require treatment as it is not inflamed on the CT scan. Diverticulosis is very common and 60 % of those at his age would have it.  He may have IBS constipation.  He has significant amount of stool on his CAT scan and he does have pain prior to a bowel movement.  There are features seen on his esophagus may be related to esophagitis.  Plan 1.  Prilosec 40 mg twice daily 2.  EGD to evaluate abnormality seen on CT scan 3.  High-fiber diet and adjust 1 capful of MiraLAX daily for constipation.  If no better at next visit we will start him on Linzess 4.  Patient information on diverticulosis  I have discussed alternative options, risks & benefits,  which include, but are not limited to, bleeding, infection, perforation,respiratory complication & drug reaction.  The patient agrees with this plan & written consent will be obtained.     Follow up in 3 months  Dr Shaun Bellows MD,MRCP(U.K)

## 2021-04-09 NOTE — Addendum Note (Signed)
Addended by: Wayna Chalet on: 04/09/2021 04:37 PM   Modules accepted: Orders

## 2021-04-09 NOTE — Telephone Encounter (Signed)
I did not see a result note on CT please advise, I see order for referral was put in yesterday it stated LLQ likely d/t to partial avascular necrosis of L hip. Please advise. KW

## 2021-04-09 NOTE — Patient Instructions (Addendum)
High-Fiber Eating Plan °Fiber, also called dietary fiber, is a type of carbohydrate. It is found foods such as fruits, vegetables, whole grains, and beans. A high-fiber diet can have many health benefits. Your health care provider may recommend a high-fiber diet to help: °Prevent constipation. Fiber can make your bowel movements more regular. °Lower your cholesterol. °Relieve the following conditions: °Inflammation of veins in the anus (hemorrhoids). °Inflammation of specific areas of the digestive tract (uncomplicated diverticulosis). °A problem of the large intestine, also called the colon, that sometimes causes pain and diarrhea (irritable bowel syndrome, or IBS). °Prevent overeating as part of a weight-loss plan. °Prevent heart disease, type 2 diabetes, and certain cancers. °What are tips for following this plan? °Reading food labels ° °Check the nutrition facts label on food products for the amount of dietary fiber. Choose foods that have 5 grams of fiber or more per serving. °The goals for recommended daily fiber intake include: °Men (age 50 or younger): 34-38 g. °Men (over age 50): 28-34 g. °Women (age 50 or younger): 25-28 g. °Women (over age 50): 22-25 g. °Your daily fiber goal is _____________ g. °Shopping °Choose whole fruits and vegetables instead of processed forms, such as apple juice or applesauce. °Choose a wide variety of high-fiber foods such as avocados, lentils, oats, and kidney beans. °Read the nutrition facts label of the foods you choose. Be aware of foods with added fiber. These foods often have high sugar and sodium amounts per serving. °Cooking °Use whole-grain flour for baking and cooking. °Cook with brown rice instead of white rice. °Meal planning °Start the day with a breakfast that is high in fiber, such as a cereal that contains 5 g of fiber or more per serving. °Eat breads and cereals that are made with whole-grain flour instead of refined flour or white flour. °Eat brown rice, bulgur  wheat, or millet instead of white rice. °Use beans in place of meat in soups, salads, and pasta dishes. °Be sure that half of the grains you eat each day are whole grains. °General information °You can get the recommended daily intake of dietary fiber by: °Eating a variety of fruits, vegetables, grains, nuts, and beans. °Taking a fiber supplement if you are not able to take in enough fiber in your diet. It is better to get fiber through food than from a supplement. °Gradually increase how much fiber you consume. If you increase your intake of dietary fiber too quickly, you may have bloating, cramping, or gas. °Drink plenty of water to help you digest fiber. °Choose high-fiber snacks, such as berries, raw vegetables, nuts, and popcorn. °What foods should I eat? °Fruits °Berries. Pears. Apples. Oranges. Avocado. Prunes and raisins. Dried figs. °Vegetables °Sweet potatoes. Spinach. Kale. Artichokes. Cabbage. Broccoli. Cauliflower. Green peas. Carrots. Squash. °Grains °Whole-grain breads. Multigrain cereal. Oats and oatmeal. Brown rice. Barley. Bulgur wheat. Millet. Quinoa. Bran muffins. Popcorn. Rye wafer crackers. °Meats and other proteins °Navy beans, kidney beans, and pinto beans. Soybeans. Split peas. Lentils. Nuts and seeds. °Dairy °Fiber-fortified yogurt. °Beverages °Fiber-fortified soy milk. Fiber-fortified orange juice. °Other foods °Fiber bars. °The items listed above may not be a complete list of recommended foods and beverages. Contact a dietitian for more information. °What foods should I avoid? °Fruits °Fruit juice. Cooked, strained fruit. °Vegetables °Fried potatoes. Canned vegetables. Well-cooked vegetables. °Grains °White bread. Pasta made with refined flour. White rice. °Meats and other proteins °Fatty cuts of meat. Fried chicken or fried fish. °Dairy °Milk. Yogurt. Cream cheese. Sour cream. °Fats and   oils °Butters. °Beverages °Soft drinks. °Other foods °Cakes and pastries. °The items listed above may  not be a complete list of foods and beverages to avoid. Talk with your dietitian about what choices are best for you. °Summary °Fiber is a type of carbohydrate. It is found in foods such as fruits, vegetables, whole grains, and beans. °A high-fiber diet has many benefits. It can help to prevent constipation, lower blood cholesterol, aid weight loss, and reduce your risk of heart disease, diabetes, and certain cancers. °Increase your intake of fiber gradually. Increasing fiber too quickly may cause cramping, bloating, and gas. Drink plenty of water while you increase the amount of fiber you consume. °The best sources of fiber include whole fruits and vegetables, whole grains, nuts, seeds, and beans. °This information is not intended to replace advice given to you by your health care provider. Make sure you discuss any questions you have with your health care provider. °Document Revised: 06/29/2019 Document Reviewed: 06/29/2019 °Elsevier Patient Education © 2022 [employer] ° °

## 2021-04-10 NOTE — Telephone Encounter (Signed)
-  thickened wall of esophagus, pipe from mouth to stomach -this retained the dye you swallowed for the procedure did not make it all the way from your mouth to your stomach -small pockets seen with your colon; encourage higher fiber diet to ensure food is passing through quicker. -large stool burden, which indicates constipation -reasons for referral to GI -primary concern, which is why I think you are having the left belly pains, is the partial breakdown of bone and death of bone in the L hip joint- this is why you were referred to ortho.  Thanks EP

## 2021-04-24 ENCOUNTER — Telehealth: Payer: Self-pay

## 2021-04-24 NOTE — Telephone Encounter (Signed)
Patient called to cancel his procedure because he can't afford it. He stated that he would call us back if he needed [place]. He also cancelled his follow up appointment since he will not do his procedure.

## 2021-05-07 ENCOUNTER — Ambulatory Visit: Admit: 2021-05-07 | Payer: 59 | Admitting: Gastroenterology

## 2021-05-07 SURGERY — ESOPHAGOGASTRODUODENOSCOPY (EGD) WITH PROPOFOL
Anesthesia: General

## 2021-05-27 ENCOUNTER — Telehealth: Payer: Self-pay | Admitting: Gastroenterology

## 2021-05-27 NOTE — Telephone Encounter (Signed)
Patients spouse called to request a medication (linzess). States that Dr [name] said to request medication if pt has not gotten better. ?

## 2021-05-27 NOTE — Telephone Encounter (Signed)
Dr. [name], patient had called last week to cancel his procedure because he can not afford it. He also cancelled his follow up appointment because he was not going to do the procedure. His wife wants to know if you could send him the prescription Linzess since he is still having constipation. Please advise.  ?

## 2021-05-28 MED ORDER — LINACLOTIDE 145 MCG PO CAPS: 145.0000 ug | ORAL_CAPSULE | Freq: Every day | ORAL | 3 refills | Status: DC

## 2021-05-28 MED ORDER — LUBIPROSTONE 8 MCG PO CAPS: 8.0000 ug | ORAL_CAPSULE | Freq: Two times a day (BID) | ORAL | 2 refills | Status: DC

## 2021-05-28 NOTE — Telephone Encounter (Signed)
Called patient back and he stated that he went ahead and got a 30 day supply of Linzess to see if that would help him. If it didn't, he was going to see if Baker Pierini is covered by his insurance and try that. I told him to let us know and patient agreed. ?

## 2021-05-28 NOTE — Telephone Encounter (Signed)
Patient called and requested an alternative medication. Patient states that his insurance does not cover Linzess and it would cost him $500+. Patient requesting call back as soon as possible. ?

## 2021-05-28 NOTE — Telephone Encounter (Signed)
Called patient but had to leave him a detailed message letting him know that Dr. [name] agreed on him starting Linzess 145 mcg daily. Prescription was sent to his [facility]. I told him to call us if he had further questions or if he needed Dr. [name] again. ?

## 2021-05-28 NOTE — Telephone Encounter (Signed)
Dr. [name], what other medication would you like to try? ?

## 2021-05-28 NOTE — Addendum Note (Signed)
Addended by: Wayna Chalet on: 05/28/2021 04:45 PM ? ? Modules accepted: Orders ? ?

## 2021-05-28 NOTE — Addendum Note (Signed)
Addended by: Wayna Chalet on: 05/28/2021 10:28 AM ? ? Modules accepted: Orders ? ?

## 2021-06-30 ENCOUNTER — Other Ambulatory Visit: Payer: Self-pay | Admitting: Family Medicine

## 2021-06-30 DIAGNOSIS — F4001 Agoraphobia with panic disorder: Secondary | ICD-10-CM

## 2021-07-07 ENCOUNTER — Ambulatory Visit: Payer: 59 | Admitting: Gastroenterology

## 2021-08-06 ENCOUNTER — Other Ambulatory Visit: Payer: Self-pay | Admitting: Family Medicine

## 2021-08-06 DIAGNOSIS — R1084 Generalized abdominal pain: Secondary | ICD-10-CM

## 2021-08-06 DIAGNOSIS — F4001 Agoraphobia with panic disorder: Secondary | ICD-10-CM

## 2021-08-06 MED ORDER — MELOXICAM 15 MG PO TABS: 15.0000 mg | ORAL_TABLET | Freq: Every day | ORAL | 0 refills | Status: DC

## 2021-08-06 NOTE — Telephone Encounter (Signed)
[facility] faxed refill request for the following medications:  traZODone (DESYREL) 50 MG tablet   Please advise.

## 2021-09-10 ENCOUNTER — Encounter: Payer: Self-pay | Admitting: Gastroenterology

## 2021-09-10 ENCOUNTER — Other Ambulatory Visit: Payer: Self-pay | Admitting: Family Medicine

## 2021-09-10 DIAGNOSIS — F4001 Agoraphobia with panic disorder: Secondary | ICD-10-CM

## 2021-09-10 DIAGNOSIS — G479 Sleep disorder, unspecified: Secondary | ICD-10-CM

## 2021-09-10 MED ORDER — LUBIPROSTONE 8 MCG PO CAPS: 8.0000 ug | ORAL_CAPSULE | Freq: Two times a day (BID) | ORAL | 2 refills | Status: DC

## 2021-09-10 NOTE — Telephone Encounter (Signed)
Hello Dr. [name].  i was treated awhile back, for ibs+c.  the prescription for lubiprostone 8 MCG capsule, i took for a month. my insurance will not pay for it or the linzess,  ive went back to taking the miralax but the cramping and inflammation hasn't stopped and is making my life miserable, trying to eat right also. is there anything you can give me for the cramping and inflamation.   Thankyou.Shaun Griffith

## 2021-09-10 NOTE — Addendum Note (Signed)
Addended by: Lurlean Nanny on: 09/10/2021 06:13 PM   Modules accepted: Orders

## 2021-09-11 NOTE — Telephone Encounter (Signed)
Called pt and got him scheduled for 6 month check/refills on September 16, 2021 with Tally [name], FNP at 2:00. Provider to review for refills prior to appt. Since courtesy refills have been given.
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
## 2021-09-16 ENCOUNTER — Ambulatory Visit (INDEPENDENT_AMBULATORY_CARE_PROVIDER_SITE_OTHER): Payer: 59 | Admitting: Family Medicine

## 2021-09-16 ENCOUNTER — Encounter: Payer: Self-pay | Admitting: Family Medicine

## 2021-09-16 VITALS — BP 144/86 | HR 80 | Temp 98.5°F | Resp 16 | Ht 75.0 in | Wt 276.0 lb

## 2021-09-16 DIAGNOSIS — F339 Major depressive disorder, recurrent, unspecified: Secondary | ICD-10-CM

## 2021-09-16 DIAGNOSIS — G479 Sleep disorder, unspecified: Secondary | ICD-10-CM | POA: Diagnosis not present

## 2021-09-16 DIAGNOSIS — I10 Essential (primary) hypertension: Secondary | ICD-10-CM

## 2021-09-16 DIAGNOSIS — F4001 Agoraphobia with panic disorder: Secondary | ICD-10-CM | POA: Diagnosis not present

## 2021-09-16 DIAGNOSIS — J301 Allergic rhinitis due to pollen: Secondary | ICD-10-CM

## 2021-09-16 DIAGNOSIS — F332 Major depressive disorder, recurrent severe without psychotic features: Secondary | ICD-10-CM | POA: Insufficient documentation

## 2021-09-16 MED ORDER — LISINOPRIL-HYDROCHLOROTHIAZIDE 20-25 MG PO TABS: 1.0000 | ORAL_TABLET | Freq: Every day | ORAL | 3 refills | Status: DC

## 2021-09-16 MED ORDER — TRAZODONE HCL 50 MG PO TABS: 50.0000 mg | ORAL_TABLET | Freq: Every day | ORAL | 3 refills | Status: DC

## 2021-09-16 MED ORDER — SERTRALINE HCL 50 MG PO TABS: 50.0000 mg | ORAL_TABLET | Freq: Every day | ORAL | 1 refills | Status: DC

## 2021-09-16 MED ORDER — LORAZEPAM 0.5 MG PO TABS: ORAL_TABLET | ORAL | 5 refills | Status: DC

## 2021-09-16 MED ORDER — FLUTICASONE PROPIONATE 50 MCG/ACT NA SUSP: 2.0000 | Freq: Every day | NASAL | 6 refills | Status: DC

## 2021-09-16 MED ORDER — METOPROLOL SUCCINATE ER 50 MG PO TB24: 50.0000 mg | ORAL_TABLET | Freq: Every day | ORAL | 3 refills | Status: DC

## 2021-09-16 NOTE — Assessment & Plan Note (Signed)
Chronic, stable Continue flonase to assist

## 2021-09-16 NOTE — Assessment & Plan Note (Signed)
Chronic, exacerbated since breakup and with chronic health concerns Add zoloft at 50 mg Continue 0.5 mg PRN qd ativan Seek counseling

## 2021-09-16 NOTE — Assessment & Plan Note (Signed)
Chronic, stable Continue trazodone 50 mg qHS to assist

## 2021-09-16 NOTE — Assessment & Plan Note (Signed)
>>  ASSESSMENT AND PLAN FOR DEPRESSION, RECURRENT (HCC) WRITTEN ON 09/16/2021  2:43 PM BY PAYNE, ELISE T, FNP  Chronic, exacerbated Add zoloft 50 mg to assist I've explained to him that drugs of the SSRI class can have side effects such as weight gain, sexual dysfunction, insomnia, headache, nausea. These medications are generally effective at alleviating symptoms of anxiety and/or depression. Let me know if significant side effects do occur.

## 2021-09-16 NOTE — Assessment & Plan Note (Signed)
Chronic, elevated Recommend addition of lisinopril-hctz to metop 50 mg 6 week follow up

## 2021-09-16 NOTE — Patient Instructions (Signed)
The CDC recommends two doses of Shingrix (the new shingles vaccine) separated by 2 to 6 months for adults age 62 years and older. I recommend checking with your insurance plan regarding coverage for this vaccine.    

## 2021-09-16 NOTE — Assessment & Plan Note (Signed)
Chronic, exacerbated Add zoloft 50 mg to assist I've explained to him that drugs of the SSRI class can have side effects such as weight gain, sexual dysfunction, insomnia, headache, nausea. These medications are generally effective at alleviating symptoms of anxiety and/or depression. Let me know if significant side effects do occur.

## 2021-10-21 ENCOUNTER — Other Ambulatory Visit: Payer: Self-pay | Admitting: Family Medicine

## 2021-10-21 DIAGNOSIS — Z125 Encounter for screening for malignant neoplasm of prostate: Secondary | ICD-10-CM

## 2021-10-21 NOTE — Telephone Encounter (Signed)
Requested Prescriptions  Pending Prescriptions Disp Refills  . gabapentin (NEURONTIN) 300 MG capsule [Pharmacy Med Name: GABAPENTIN 300 MG CAP] 120 capsule 0    Sig: TAKE ONE (1) CAPSULE BY MOUTH IN THE MORNING, ONE (1) IN THE AFTERNOON, AND TWO (2) AT BEDTIME     Neurology: Anticonvulsants - gabapentin Passed - 10/21/2021 10:26 AM      Passed - Cr in normal range and within 360 days    Creat  Date Value Ref Range Status  12/31/2016 0.73 0.70 - 1.33 mg/dL Final    Comment:    For patients >57 years of age, the reference limit for Creatinine is approximately 13% higher for people identified as African-American. .    Creatinine, Ser  Date Value Ref Range Status  03/04/2021 0.99 0.76 - 1.27 mg/dL Final         Passed - Completed PHQ-2 or PHQ-9 in the last 360 days      Passed - Valid encounter within last 12 months    Recent Outpatient Visits          1 month ago Depression, recurrent [facility])   [facility] [name], FNP   7 months ago Chronic LLQ pain   [facility] [name], FNP   1 year ago Annual physical exam   [place], Vickki Muff, PA-C   1 year ago Primary hypertension   [place], Kelby Aline, [place]   1 year ago Otalgia of both [place], Wendee Beavers, [place]      Future Appointments            In 3 weeks [name], [place], [address]

## 2021-11-04 ENCOUNTER — Encounter: Payer: 59 | Admitting: Family Medicine

## 2021-11-11 NOTE — Progress Notes (Signed)
I,[name],acting as a Education administrator for Shaun Sprout, FNP.,have documented all relevant documentation on the behalf of Shaun Sprout, FNP,as directed by  Shaun Sprout, FNP while in the presence of Shaun Sprout, FNP.    Complete physical exam   Patient: Shaun Griffith   DOB: 1960/10/29   61 y.o. Male  MRN: [id number] Visit Date: 11/13/2021  Today's healthcare provider: [name], FNP  Re Introduced to nurse practitioner role and practice setting.  All questions answered.  Discussed provider/patient relationship and expectations.   Chief Complaint  Patient presents with   Annual Exam   Subjective    Shaun Griffith is a 61 y.o. male who presents today for a complete physical exam.  He reports consuming a general diet. The patient does not participate in regular exercise at present. He generally feels fairly well. He reports sleeping poorly. He does not have additional problems to discuss today.  HPI    Past Medical History:  Diagnosis Date   Anxiety    Depression    Hypertension    Past Surgical History:  Procedure Laterality Date   BACK SURGERY  1990   HNP L5   COLONOSCOPY     COLONOSCOPY WITH PROPOFOL N/A 05/13/2017   Procedure: COLONOSCOPY WITH PROPOFOL;  Surgeon: Jonathon Bellows, MD;  Location: [facility] ENDOSCOPY;  Service: Gastroenterology;  Laterality: N/A;   HERNIA REPAIR  1984   right groin   KNEE SURGERY Left 1992   Social History   Socioeconomic History   Marital status: Divorced    Spouse name: Not on file   Number of children: Not on file   Years of education: Not on file   Highest education level: Not on file  Occupational History   Not on file  Tobacco Use   Smoking status: Former    Types: Cigarettes   Smokeless tobacco: Never  Vaping Use   Vaping Use: Never used  Substance and Sexual Activity   Alcohol use: Yes    Alcohol/week: 12.0 standard drinks of alcohol    Types: 12 Cans of beer per week    Comment: Moderate alcohol use, drinks  beer 3-4 days a week; drinks 8-10 beers   Drug use: No   Sexual activity: Yes  Other Topics Concern   Not on file  Social History Narrative   Not on file   Social Determinants of Health   Financial Resource Strain: Not on file  Food Insecurity: Not on file  Transportation Needs: Not on file  Physical Activity: Not on file  Stress: Not on file  Social Connections: Not on file  Intimate Partner Violence: Not on file   Family Status  Relation Name Status   Mother  Deceased at age 32       Died from a stroke   Father  Deceased at age 42       Had kidney failure   MGM  Deceased   PGM  Deceased   PGF  Deceased   Family History  Adopted: Yes  Problem Relation Age of Onset   Diabetes Mother    Depression Mother    Cancer Father        skin and blood   Allergies  Allergen Reactions   Sulfadimethoxine Nausea Only    flu like symptoms    Patient Care Team: Shaun Sprout, FNP as PCP - General (Family Medicine)   Medications: Outpatient Medications Prior to Visit  Medication Sig  Azelastine-Fluticasone (DYMISTA) 137-50 MCG/ACT SUSP One spray per nostril 2x daily.   fluticasone (FLONASE) 50 MCG/ACT nasal spray Place 2 sprays into both nostrils daily.   gabapentin (NEURONTIN) 300 MG capsule TAKE ONE (1) CAPSULE BY MOUTH IN THE MORNING, ONE (1) IN THE AFTERNOON, AND TWO (2) AT BEDTIME   LORazepam (ATIVAN) 0.5 MG tablet One tablet daily as needed for anxiety   metoprolol succinate (TOPROL-XL) 50 MG 24 hr tablet Take 1 tablet (50 mg total) by mouth daily. Take with or immediately following a meal.   omeprazole (PRILOSEC) 40 MG capsule Take 1 capsule (40 mg total) by mouth in the morning and at bedtime.   sertraline (ZOLOFT) 50 MG tablet Take 1 tablet (50 mg total) by mouth daily.   traZODone (DESYREL) 50 MG tablet Take 1 tablet (50 mg total) by mouth at bedtime.   [DISCONTINUED] lisinopril-hydrochlorothiazide (ZESTORETIC) 20-25 MG tablet Take 1 tablet by mouth daily.    [DISCONTINUED] lubiprostone (AMITIZA) 8 MCG capsule Take 1 capsule (8 mcg total) by mouth 2 (two) times daily with a meal.   No facility-administered medications prior to visit.    Review of Systems  Constitutional:  Positive for fatigue.  HENT:  Positive for congestion.   Respiratory:  Positive for apnea.   Gastrointestinal:  Positive for abdominal distention, abdominal pain, constipation and diarrhea.  Neurological:  Positive for dizziness.  Psychiatric/Behavioral:  Positive for sleep disturbance. The patient is nervous/anxious.   All other systems reviewed and are negative.   Last CBC Lab Results  Component Value Date   WBC 7.0 03/04/2021   HGB 16.5 03/04/2021   HCT 49.5 03/04/2021   MCV 99 (H) 03/04/2021   MCH 33.1 (H) 03/04/2021   RDW 12.1 03/04/2021   PLT 157 25/07/3974   Last metabolic panel Lab Results  Component Value Date   GLUCOSE 94 03/04/2021   NA 143 03/04/2021   K 4.1 03/04/2021   CL 102 03/04/2021   CO2 23 03/04/2021   BUN 16 03/04/2021   CREATININE 0.99 03/04/2021   EGFR 87 03/04/2021   CALCIUM 9.4 03/04/2021   PROT 7.1 03/04/2021   ALBUMIN 4.5 03/04/2021   LABGLOB 2.6 03/04/2021   AGRATIO 1.7 03/04/2021   BILITOT 0.5 03/04/2021   ALKPHOS 55 03/04/2021   AST 21 03/04/2021   ALT 19 03/04/2021   Last lipids Lab Results  Component Value Date   CHOL 164 09/20/2020   HDL 63 09/20/2020   LDLCALC 85 09/20/2020   TRIG 84 09/20/2020   CHOLHDL 2.6 09/20/2020   Last hemoglobin A1c Lab Results  Component Value Date   HGBA1C 5.1 12/31/2016   Last thyroid functions Lab Results  Component Value Date   TSH 1.450 09/20/2020      Objective     BP 106/74 (BP Location: Right Arm, Patient Position: Sitting, Cuff Size: Large)   Pulse 75   Temp 98.5 F (36.9 C) (Oral)   Resp 16   Ht '6\' 3"'  (1.905 m)   Wt 266 lb (120.7 kg)   SpO2 99%   BMI 33.25 kg/m   BP Readings from Last 3 Encounters:  11/13/21 106/74  09/16/21 (!) 144/86  04/09/21 (!)  164/104   Wt Readings from Last 3 Encounters:  11/13/21 266 lb (120.7 kg)  09/16/21 276 lb (125.2 kg)  04/09/21 280 lb (127 kg)    Physical Exam Vitals and nursing note reviewed.  Constitutional:      General: He is awake. He is not in acute distress.  Appearance: Normal appearance. He is well-developed and well-groomed. He is obese. He is not ill-appearing, toxic-appearing or diaphoretic.  HENT:     Head: Normocephalic and atraumatic.     Jaw: There is normal jaw occlusion. No trismus, tenderness, swelling or pain on movement.     Salivary Glands: Right salivary gland is not diffusely enlarged or tender. Left salivary gland is not diffusely enlarged or tender.     Right Ear: Hearing, tympanic membrane, ear canal and external ear normal. There is no impacted cerumen.     Left Ear: Hearing, tympanic membrane, ear canal and external ear normal. There is no impacted cerumen.     Nose: Nose normal. No congestion or rhinorrhea.     Right Turbinates: Not enlarged, swollen or pale.     Left Turbinates: Not enlarged, swollen or pale.     Right Sinus: No maxillary sinus tenderness or frontal sinus tenderness.     Left Sinus: No maxillary sinus tenderness or frontal sinus tenderness.     Mouth/Throat:     Lips: Pink.     Mouth: Mucous membranes are moist. No injury, lacerations, oral lesions or angioedema.     Pharynx: Oropharynx is clear. Uvula midline. No pharyngeal swelling, oropharyngeal exudate or posterior oropharyngeal erythema.     Tonsils: No tonsillar exudate or tonsillar abscesses.  Eyes:     General: Lids are normal. Vision grossly intact. Gaze aligned appropriately.        Right eye: No discharge.        Left eye: No discharge.     Extraocular Movements: Extraocular movements intact.     Conjunctiva/sclera: Conjunctivae normal.     Pupils: Pupils are equal, round, and reactive to light.  Neck:     Thyroid: No thyroid mass, thyromegaly or thyroid tenderness.     Vascular: No  carotid bruit.     Trachea: Trachea normal. No tracheal tenderness.  Cardiovascular:     Rate and Rhythm: Normal rate and regular rhythm.     Pulses: Normal pulses.          Carotid pulses are 2+ on the right side and 2+ on the left side.      Radial pulses are 2+ on the right side and 2+ on the left side.       Femoral pulses are 2+ on the right side and 2+ on the left side.      Popliteal pulses are 2+ on the right side and 2+ on the left side.       Dorsalis pedis pulses are 2+ on the right side and 2+ on the left side.       Posterior tibial pulses are 2+ on the right side and 2+ on the left side.     Heart sounds: Normal heart sounds, S1 normal and S2 normal. No murmur heard.    No friction rub. No gallop.  Pulmonary:     Effort: Pulmonary effort is normal. No respiratory distress.     Breath sounds: Normal breath sounds and air entry. No stridor. No wheezing, rhonchi or rales.  Chest:     Chest wall: No tenderness.  Abdominal:     General: Abdomen is flat. Bowel sounds are normal. There is no distension.     Palpations: Abdomen is soft. There is no mass.     Tenderness: There is no abdominal tenderness. There is no guarding or rebound.     Hernia: No hernia is present.  Genitourinary:  Comments: Exam deferred; denies complaints Musculoskeletal:        General: No swelling, tenderness, deformity or signs of injury. Normal range of motion.     Cervical back: Normal range of motion and neck supple. No rigidity or tenderness.     Right lower leg: No edema.     Left lower leg: No edema.  Lymphadenopathy:     Cervical: No cervical adenopathy.     Right cervical: No superficial, deep or posterior cervical adenopathy.    Left cervical: No superficial, deep or posterior cervical adenopathy.  Skin:    General: Skin is warm and dry.     Capillary Refill: Capillary refill takes less than 2 seconds.     Coloration: Skin is not jaundiced or pale.     Findings: No bruising, erythema,  lesion or rash.  Neurological:     General: No focal deficit present.     Mental Status: He is alert and oriented to person, place, and time. Mental status is at baseline.     GCS: GCS eye subscore is 4. GCS verbal subscore is 5. GCS motor subscore is 6.     Sensory: Sensation is intact. No sensory deficit.     Motor: Motor function is intact. No weakness.     Coordination: Coordination is intact.     Gait: Gait is intact.  Psychiatric:        Attention and Perception: Attention and perception normal.        Mood and Affect: Mood is depressed. Affect is flat.        Speech: Speech normal.        Behavior: Behavior normal. Behavior is cooperative.        Thought Content: Thought content normal.        Cognition and Memory: Cognition normal.        Judgment: Judgment normal.     Last depression screening scores    11/13/2021    2:52 PM 09/16/2021    2:00 PM 09/20/2020    1:34 PM  PHQ 2/9 Scores  PHQ - 2 Score 3 5 0  PHQ- 9 Score '10 12 3   ' Last fall risk screening    11/13/2021    2:52 PM  [place] in the past year? 0  Number falls in past yr: 0  Injury with Fall? 0  Risk for fall due to : No Fall Risks  Follow up Falls evaluation completed   Last Audit-C alcohol use screening    11/13/2021    2:59 PM  Alcohol Use Disorder Test (AUDIT)  1. How often do you have a drink containing alcohol? 2  2. How many drinks containing alcohol do you have on a typical day when you are drinking? 4  3. How often do you have six or more drinks on one occasion? 4  AUDIT-C Score 10  4. How often during the last year have you found that you were not able to stop drinking once you had started? 0  5. How often during the last year have you failed to do what was normally expected from you because of drinking? 0  6. How often during the last year have you needed a first drink in the morning to get yourself going after a heavy drinking session? 0  7. How often during the last year have you  had a feeling of guilt of remorse after drinking? 0  8. How often during the last year have  you been unable to remember what happened the night before because you had been drinking? 0  9. Have you or someone else been injured as a result of your drinking? 0  10. Has a relative or friend or a doctor or another health worker been concerned about your drinking or suggested you cut down? 0  Alcohol Use Disorder Identification Test Final Score (AUDIT) 10  Alcohol Brief Interventions/Follow-up Alcohol education/Brief advice   A score of 3 or more in women, and 4 or more in men indicates increased risk for alcohol abuse, EXCEPT if all of the points are from question 1   No results found for any visits on 11/13/21.  Assessment & Plan    Routine Health Maintenance and Physical Exam  Exercise Activities and Dietary recommendations  Goals   None     Immunization History  Administered Date(s) Administered   Influenza,inj,Quad PF,6+ Mos 12/31/2015, 01/20/2018   Influenza-Unspecified 01/01/2015   Janssen (J&J) SARS-COV-2 Vaccination 10/31/2019   Moderna Sars-Covid-2 Vaccination 03/05/2020   Tdap 12/31/2015    Health Maintenance  Topic Date Due   Zoster Vaccines- Shingrix (1 of 2) Never done   COVID-19 Vaccine (3 - Booster for [employer] series) 04/30/2020   INFLUENZA VACCINE  06/07/2022 (Originally 10/07/2021)   COLONOSCOPY (Pts 45-36yr Insurance coverage will need to be confirmed)  05/14/2022   TETANUS/TDAP  12/30/2025   Hepatitis C Screening  Completed   HIV Screening  Completed   HPV VACCINES  Aged Out    Discussed health benefits of physical activity, and encouraged him to engage in regular exercise appropriate for his age and condition.  Problem List Items Addressed This Visit       Cardiovascular and Mediastinum   Essential hypertension    Chronic, slight hypotension with complaints of dizziness; recommend 1/2 of new agent from 7/23- lisinopril/hctz (20-25); new Rx called in for  10-12.5 with f/u in 1 month after change; continue metop at 50 mg  Denies CP Denies SOB/ DOE Denies vision changes No LE Edema noted on exam Seek emergent care if you develop chest pain or chest pressure       Relevant Medications   lisinopril-hydrochlorothiazide (ZESTORETIC) 10-12.5 MG tablet   Other Relevant Orders   Comprehensive metabolic panel   Lipid Panel With LDL/HDL Ratio     Other   Annual physical exam - Primary    Due for dental and vision Things to do to keep yourself healthy  - Exercise at least 30-45 minutes a day, 3-4 days a week.  - Eat a low-fat diet with lots of fruits and vegetables, up to 7-9 servings per day.  - Seatbelts can save your life. Wear them always.  - Smoke detectors on every level of your home, check batteries every year.  - Eye Doctor - have an eye exam every 1-2 years  - Safe sex - if you may be exposed to STDs, use a condom.  - Alcohol -  If you drink, do it moderately, less than 2 drinks per day.  - [place] Choose someone to speak for you if you are not able.  - Depression is common in our stressful world.If you're feeling down or losing interest in things you normally enjoy, please come in for a visit.  - Violence - If anyone is threatening or hurting you, please call immediately.        Relevant Orders   Comprehensive metabolic panel   Lipid Panel With LDL/HDL Ratio  CBC with Differential/Platelet   PSA   TSH + free T4   Depression, recurrent (HCC)    Chronic, slight improved Reports depression concerns r/t GI problems Contracted for safety; denies SI or HI      Screening for diabetes mellitus    Continue to recommend balanced, lower carb meals. Smaller meal size, adding snacks. Choosing water as drink of choice and increasing purposeful exercise. Screening A1c given HTN and HLD       Relevant Orders   Hemoglobin A1c   Screening for malignant neoplasm of prostate    Defer DRE; check PSA Denies  symptoms       Relevant Orders   PSA     Return in about 4 weeks (around 12/11/2021) for HTN management.     Vonna Kotyk, FNP, have reviewed all documentation for this visit. The documentation on 11/13/21 for the exam, diagnosis, procedures, and orders are all accurate and complete.    Shaun Griffith, [place] [phone] (phone) [phone] (fax)  [place]

## 2021-11-13 ENCOUNTER — Encounter: Payer: Self-pay | Admitting: Family Medicine

## 2021-11-13 ENCOUNTER — Ambulatory Visit (INDEPENDENT_AMBULATORY_CARE_PROVIDER_SITE_OTHER): Payer: 59 | Admitting: Family Medicine

## 2021-11-13 VITALS — BP 106/74 | HR 75 | Temp 98.5°F | Resp 16 | Ht 75.0 in | Wt 266.0 lb

## 2021-11-13 DIAGNOSIS — E781 Pure hyperglyceridemia: Secondary | ICD-10-CM | POA: Diagnosis not present

## 2021-11-13 DIAGNOSIS — I10 Essential (primary) hypertension: Secondary | ICD-10-CM

## 2021-11-13 DIAGNOSIS — Z125 Encounter for screening for malignant neoplasm of prostate: Secondary | ICD-10-CM

## 2021-11-13 DIAGNOSIS — Z0000 Encounter for general adult medical examination without abnormal findings: Secondary | ICD-10-CM

## 2021-11-13 DIAGNOSIS — Z131 Encounter for screening for diabetes mellitus: Secondary | ICD-10-CM | POA: Diagnosis not present

## 2021-11-13 DIAGNOSIS — F339 Major depressive disorder, recurrent, unspecified: Secondary | ICD-10-CM

## 2021-11-13 MED ORDER — LISINOPRIL-HYDROCHLOROTHIAZIDE 20-25 MG PO TABS: 0.5000 | ORAL_TABLET | Freq: Every day | ORAL | 0 refills | Status: DC

## 2021-11-13 MED ORDER — LISINOPRIL-HYDROCHLOROTHIAZIDE 10-12.5 MG PO TABS: 1.0000 | ORAL_TABLET | Freq: Every day | ORAL | 1 refills | Status: DC

## 2021-11-13 MED ORDER — LUBIPROSTONE 8 MCG PO CAPS: 8.0000 ug | ORAL_CAPSULE | Freq: Every day | ORAL | 2 refills | Status: DC

## 2021-11-13 NOTE — Assessment & Plan Note (Signed)
Continue to recommend balanced, lower carb meals. Smaller meal size, adding snacks. Choosing water as drink of choice and increasing purposeful exercise. Screening A1c given HTN and HLD

## 2021-11-13 NOTE — Assessment & Plan Note (Signed)
Chronic, slight improved Reports depression concerns r/t GI problems Contracted for safety; denies SI or HI

## 2021-11-13 NOTE — Assessment & Plan Note (Addendum)
Chronic, slight hypotension with complaints of dizziness; recommend 1/2 of new agent from 7/23- lisinopril/hctz (20-25); new Rx called in for 10-12.5 with f/u in 1 month after change; continue metop at 50 mg  Denies CP Denies SOB/ DOE Denies vision changes No LE Edema noted on exam Seek emergent care if you develop chest pain or chest pressure

## 2021-11-13 NOTE — Assessment & Plan Note (Signed)
Defer DRE; check PSA Denies symptoms

## 2021-11-13 NOTE — Assessment & Plan Note (Signed)
Due for dental and vision Things to do to keep yourself healthy  - Exercise at least 30-45 minutes a day, 3-4 days a week.  - Eat a low-fat diet with lots of fruits and vegetables, up to 7-9 servings per day.  - Seatbelts can save your life. Wear them always.  - Smoke detectors on every level of your home, check batteries every year.  - Eye Doctor - have an eye exam every 1-2 years  - Safe sex - if you may be exposed to STDs, use a condom.  - Alcohol -  If you drink, do it moderately, less than 2 drinks per day.  - Health Care Power of Attorney. Choose someone to speak for you if you are not able.  - Depression is common in our stressful world.If you're feeling down or losing interest in things you normally enjoy, please come in for a visit.  - Violence - If anyone is threatening or hurting you, please call immediately.  

## 2021-11-13 NOTE — Assessment & Plan Note (Signed)
>>  ASSESSMENT AND PLAN FOR DEPRESSION, RECURRENT (HCC) WRITTEN ON 11/13/2021  3:32 PM BY PAYNE, ELISE T, FNP  Chronic, slight improved Reports depression concerns r/t GI problems Contracted for safety; denies SI or HI

## 2021-11-14 ENCOUNTER — Other Ambulatory Visit: Payer: Self-pay | Admitting: Family Medicine

## 2021-11-14 LAB — LIPID PANEL WITH LDL/HDL RATIO
Cholesterol, Total: 175 mg/dL (ref 100–199)
HDL: 49 mg/dL (ref >39)
LDL Chol Calc (NIH): 95 mg/dL (ref 0–99)
LDL/HDL Ratio: 1.9 ratio (ref 0.0–3.6)
Triglycerides: 178 mg/dL — ABNORMAL HIGH (ref 0–149)
VLDL Cholesterol Cal: 31 mg/dL (ref 5–40)

## 2021-11-14 LAB — CBC WITH DIFFERENTIAL/PLATELET
Basophils Absolute: 0.1 10*3/uL (ref 0.0–0.2)
Basos: 1 %
EOS (ABSOLUTE): 0.4 10*3/uL (ref 0.0–0.4)
Eos: 5 %
Hematocrit: 43.8 % (ref 37.5–51.0)
Hemoglobin: 15 g/dL (ref 13.0–17.7)
Immature Grans (Abs): 0 10*3/uL (ref 0.0–0.1)
Immature Granulocytes: 1 %
Lymphocytes Absolute: 2.2 10*3/uL (ref 0.7–3.1)
Lymphs: 29 %
MCH: 32.9 pg (ref 26.6–33.0)
MCHC: 34.2 g/dL (ref 31.5–35.7)
MCV: 96 fL (ref 79–97)
Monocytes Absolute: 0.6 10*3/uL (ref 0.1–0.9)
Monocytes: 8 %
Neutrophils Absolute: 4.2 10*3/uL (ref 1.4–7.0)
Neutrophils: 56 %
Platelets: 161 10*3/uL (ref 150–450)
RBC: 4.56 x10E6/uL (ref 4.14–5.80)
RDW: 11.9 % (ref 11.6–15.4)
WBC: 7.5 10*3/uL (ref 3.4–10.8)

## 2021-11-14 LAB — HEMOGLOBIN A1C
Est. average glucose Bld gHb Est-mCnc: 114 mg/dL
Hgb A1c MFr Bld: 5.6 % (ref 4.8–5.6)

## 2021-11-14 LAB — COMPREHENSIVE METABOLIC PANEL
ALT: 21 IU/L (ref 0–44)
AST: 21 IU/L (ref 0–40)
Albumin/Globulin Ratio: 1.7 (ref 1.2–2.2)
Albumin: 4.5 g/dL (ref 3.9–4.9)
Alkaline Phosphatase: 58 IU/L (ref 44–121)
BUN/Creatinine Ratio: 20 (ref 10–24)
BUN: 18 mg/dL (ref 8–27)
Bilirubin Total: 0.4 mg/dL (ref 0.0–1.2)
CO2: 23 mmol/L (ref 20–29)
Calcium: 9.5 mg/dL (ref 8.6–10.2)
Chloride: 100 mmol/L (ref 96–106)
Creatinine, Ser: 0.9 mg/dL (ref 0.76–1.27)
Globulin, Total: 2.6 g/dL (ref 1.5–4.5)
Glucose: 93 mg/dL (ref 70–99)
Potassium: 3.8 mmol/L (ref 3.5–5.2)
Sodium: 139 mmol/L (ref 134–144)
Total Protein: 7.1 g/dL (ref 6.0–8.5)
eGFR: 97 mL/min/{1.73_m2} (ref >59)

## 2021-11-14 LAB — PSA: Prostate Specific Ag, Serum: 0.5 ng/mL (ref 0.0–4.0)

## 2021-11-14 LAB — TSH+FREE T4
Free T4: 1.24 ng/dL (ref 0.82–1.77)
TSH: 1.65 u[IU]/mL (ref 0.450–4.500)

## 2021-11-14 MED ORDER — ROSUVASTATIN CALCIUM 10 MG PO TABS: 10.0000 mg | ORAL_TABLET | Freq: Every day | ORAL | 1 refills | Status: DC

## 2021-11-14 NOTE — Addendum Note (Signed)
Addended by: Barnie Mort on: 11/14/2021 03:22 PM   Modules accepted: Orders

## 2021-11-14 NOTE — Progress Notes (Signed)
Labs have been reviewed and are normal and/or stable. The 10-year ASCVD risk score (Arnett DK, et al., 2019) is: 7.8%   Values used to calculate the score:     Age: 61 years     Sex: Male     Is Non-Hispanic African American: No     Diabetic: No     Tobacco smoker: No     Systolic Blood Pressure: 395 mmHg     Is BP treated: Yes     HDL Cholesterol: 49 mg/dL     Total Cholesterol: 175 mg/dL Heart attack and stroke risk is 8% estimated within the next 10 years which is moderate.  Recommend starting Crestor '10mg'$ . Please let us know if you would like to start on this medication.   Please let us know if you have any questions.  Thank you, [name], [address] [place], [place] [address] [phone] (phone) [phone] (fax) [place]

## 2021-12-12 ENCOUNTER — Encounter: Payer: Self-pay | Admitting: Gastroenterology

## 2021-12-15 NOTE — Telephone Encounter (Signed)
Is he taking PPI?if yes what dose? We had recommended EGD previously was cancelled?

## 2021-12-16 ENCOUNTER — Other Ambulatory Visit: Payer: Self-pay

## 2021-12-16 DIAGNOSIS — K209 Esophagitis, unspecified without bleeding: Secondary | ICD-10-CM

## 2021-12-16 NOTE — Progress Notes (Unsigned)
Established patient visit   Patient: Shaun Griffith   DOB: January 02, 1961   61 y.o. Male  MRN: [id number] Visit Date: 12/17/2021  Today's healthcare provider: [name], FNP  Re Introduced to nurse practitioner role and practice setting.  All questions answered.  Discussed provider/patient relationship and expectations.  I,Marylon Verno J Gaylan Fauver,acting as a scribe for [name], FNP.,have documented all relevant documentation on the behalf of [name], FNP,as directed by  [name], FNP while in the presence of [name], FNP.   Chief Complaint  Patient presents with   Hypertension   Subjective    HPI  Hypertension, follow-up  BP Readings from Last 3 Encounters:  12/17/21 127/84  11/13/21 106/74  09/16/21 (!) 144/86   Wt Readings from Last 3 Encounters:  12/17/21 265 lb (120.2 kg)  11/13/21 266 lb (120.7 kg)  09/16/21 276 lb (125.2 kg)     He was last seen for hypertension 1 months ago.  BP at that visit was 106/74. Management since that visit includes continue medications.  He reports excellent compliance with treatment. He is not having side effects.  He is following a Regular diet. He is exercising. He does smoke.  Use of agents associated with hypertension: none.   Outside blood pressures are not checked regularly. Symptoms: No chest pain No chest pressure  No palpitations No syncope  No dyspnea No orthopnea  No paroxysmal nocturnal dyspnea No lower extremity edema   Pertinent labs Lab Results  Component Value Date   CHOL 175 11/13/2021   HDL 49 11/13/2021   LDLCALC 95 11/13/2021   TRIG 178 (H) 11/13/2021   CHOLHDL 2.6 09/20/2020   Lab Results  Component Value Date   NA 139 11/13/2021   K 3.8 11/13/2021   CREATININE 0.90 11/13/2021   EGFR 97 11/13/2021   GLUCOSE 93 11/13/2021   TSH 1.650 11/13/2021     The 10-year ASCVD risk score (Arnett DK, et al., 2019) is:  10.6%  ---------------------------------------------------------------------------------------------------   Medications: Outpatient Medications Prior to Visit  Medication Sig   Azelastine-Fluticasone (DYMISTA) 137-50 MCG/ACT SUSP One spray per nostril 2x daily.   fluticasone (FLONASE) 50 MCG/ACT nasal spray Place 2 sprays into both nostrils daily.   gabapentin (NEURONTIN) 300 MG capsule TAKE ONE (1) CAPSULE BY MOUTH IN THE MORNING, ONE (1) IN THE AFTERNOON, AND TWO (2) AT BEDTIME   LORazepam (ATIVAN) 0.5 MG tablet One tablet daily as needed for anxiety   lubiprostone (AMITIZA) 8 MCG capsule Take 1 capsule (8 mcg total) by mouth daily with breakfast.   metoprolol succinate (TOPROL-XL) 50 MG 24 hr tablet Take 1 tablet (50 mg total) by mouth daily. Take with or immediately following a meal.   rosuvastatin (CRESTOR) 10 MG tablet Take 1 tablet (10 mg total) by mouth daily.   sertraline (ZOLOFT) 50 MG tablet Take 1 tablet (50 mg total) by mouth daily.   traZODone (DESYREL) 50 MG tablet Take 1 tablet (50 mg total) by mouth at bedtime.   [DISCONTINUED] lisinopril-hydrochlorothiazide (ZESTORETIC) 10-12.5 MG tablet Take 1 tablet by mouth daily.   omeprazole (PRILOSEC) 40 MG capsule Take 1 capsule (40 mg total) by mouth in the morning and at bedtime.   No facility-administered medications prior to visit.    Review of Systems    Objective    BP 127/84 (BP Location: Right Arm, Patient Position: Sitting, Cuff Size: Large)   Pulse 86   Resp 16   Ht  '6\' 2"'  (1.88 m)   Wt 265 lb (120.2 kg)   SpO2 97%   BMI 34.02 kg/m   Physical Exam Vitals and nursing note reviewed.  Constitutional:      Appearance: Normal appearance. He is obese.  HENT:     Head: Normocephalic and atraumatic.  Eyes:     Pupils: Pupils are equal, round, and reactive to light.  Cardiovascular:     Rate and Rhythm: Normal rate and regular rhythm.     Pulses: Normal pulses.     Heart sounds: Normal heart sounds.  Pulmonary:      Effort: Pulmonary effort is normal.     Breath sounds: Normal breath sounds.  Musculoskeletal:        General: Normal range of motion.     Cervical back: Normal range of motion.  Skin:    General: Skin is warm and dry.     Capillary Refill: Capillary refill takes less than 2 seconds.  Neurological:     General: No focal deficit present.     Mental Status: He is alert and oriented to person, place, and time. Mental status is at baseline.  Psychiatric:        Mood and Affect: Mood normal.        Behavior: Behavior normal.        Thought Content: Thought content normal.        Judgment: Judgment normal.     No results found for any visits on 12/17/21.  Assessment & Plan     Problem List Items Addressed This Visit       Cardiovascular and Mediastinum   Essential hypertension - Primary    Chronic, improved Declines increase in edema since reduction in blood pressure Refills provided of Zestoretic 10-12.5 for 90#      Relevant Medications   lisinopril-hydrochlorothiazide (ZESTORETIC) 10-12.5 MG tablet   Return in about 1 year (around 12/18/2022) for annual examination.      Vonna Kotyk, FNP, have reviewed all documentation for this visit. The documentation on 12/17/21 for the exam, diagnosis, procedures, and orders are all accurate and complete.  [name], [place] [phone] (phone) [phone] (fax)  [place]

## 2021-12-17 ENCOUNTER — Ambulatory Visit: Payer: 59 | Admitting: Family Medicine

## 2021-12-17 ENCOUNTER — Encounter: Payer: Self-pay | Admitting: Family Medicine

## 2021-12-17 VITALS — BP 127/84 | HR 86 | Resp 16 | Ht 74.0 in | Wt 265.0 lb

## 2021-12-17 DIAGNOSIS — I10 Essential (primary) hypertension: Secondary | ICD-10-CM | POA: Diagnosis not present

## 2021-12-17 MED ORDER — LISINOPRIL-HYDROCHLOROTHIAZIDE 10-12.5 MG PO TABS: 1.0000 | ORAL_TABLET | Freq: Every day | ORAL | 3 refills | Status: DC

## 2021-12-17 NOTE — Assessment & Plan Note (Signed)
Chronic, improved Declines increase in edema since reduction in blood pressure Refills provided of Zestoretic 10-12.5 for 90#

## 2021-12-23 ENCOUNTER — Encounter: Payer: Self-pay | Admitting: Gastroenterology

## 2021-12-24 ENCOUNTER — Encounter
Admission: RE | Disposition: A | Discharge status: Home / Self Care | Payer: Self-pay | Source: Ambulatory Visit | Attending: Gastroenterology

## 2021-12-24 ENCOUNTER — Ambulatory Visit
Admission: RE | Admit: 2021-12-24 | Discharge: 2021-12-24 | Disposition: A | Discharge status: Home / Self Care | Payer: 59 | Source: Ambulatory Visit | Attending: Gastroenterology | Admitting: Gastroenterology

## 2021-12-24 ENCOUNTER — Encounter: Payer: Self-pay | Admitting: Gastroenterology

## 2021-12-24 ENCOUNTER — Ambulatory Visit: Payer: 59 | Admitting: Certified Registered Nurse Anesthetist

## 2021-12-24 DIAGNOSIS — R1310 Dysphagia, unspecified: Secondary | ICD-10-CM | POA: Insufficient documentation

## 2021-12-24 DIAGNOSIS — K209 Esophagitis, unspecified without bleeding: Secondary | ICD-10-CM

## 2021-12-24 DIAGNOSIS — R1319 Other dysphagia: Secondary | ICD-10-CM | POA: Diagnosis not present

## 2021-12-24 DIAGNOSIS — F17210 Nicotine dependence, cigarettes, uncomplicated: Secondary | ICD-10-CM | POA: Insufficient documentation

## 2021-12-24 DIAGNOSIS — F419 Anxiety disorder, unspecified: Secondary | ICD-10-CM | POA: Insufficient documentation

## 2021-12-24 DIAGNOSIS — I10 Essential (primary) hypertension: Secondary | ICD-10-CM | POA: Insufficient documentation

## 2021-12-24 DIAGNOSIS — G4730 Sleep apnea, unspecified: Secondary | ICD-10-CM | POA: Insufficient documentation

## 2021-12-24 DIAGNOSIS — R948 Abnormal results of function studies of other organs and systems: Secondary | ICD-10-CM | POA: Diagnosis not present

## 2021-12-24 DIAGNOSIS — F32A Depression, unspecified: Secondary | ICD-10-CM | POA: Diagnosis not present

## 2021-12-24 DIAGNOSIS — R933 Abnormal findings on diagnostic imaging of other parts of digestive tract: Secondary | ICD-10-CM

## 2021-12-24 HISTORY — PX: ESOPHAGOGASTRODUODENOSCOPY (EGD) WITH PROPOFOL: SHX5813

## 2021-12-24 SURGERY — ESOPHAGOGASTRODUODENOSCOPY (EGD) WITH PROPOFOL
Anesthesia: General

## 2021-12-24 MED ORDER — PROPOFOL 500 MG/50ML IV EMUL
INTRAVENOUS | Status: DC | PRN
  Administered 2021-12-24: 165 ug/kg/min via INTRAVENOUS

## 2021-12-24 MED ORDER — PROPOFOL 10 MG/ML IV BOLUS
INTRAVENOUS | Status: DC | PRN
  Administered 2021-12-24: 100 mg via INTRAVENOUS

## 2021-12-24 MED ORDER — DICYCLOMINE HCL 10 MG PO CAPS: 10.0000 mg | ORAL_CAPSULE | Freq: Three times a day (TID) | ORAL | 0 refills | Status: DC

## 2021-12-24 MED ORDER — SODIUM CHLORIDE 0.9 % IV SOLN: INTRAVENOUS | Status: DC

## 2021-12-24 MED ORDER — LIDOCAINE HCL (CARDIAC) PF 100 MG/5ML IV SOSY
PREFILLED_SYRINGE | INTRAVENOUS | Status: DC | PRN
  Administered 2021-12-24: 50 mg via INTRAVENOUS

## 2021-12-24 NOTE — H&P (Signed)
Jonathon Bellows, MD [address], [place], [place], [place], [address] [address], [place], [place], [place], [address] Phone: [phone]  Fax: [phone]  Primary Care Physician:  [name], FNP   Pre-Procedure History & Physical: HPI:  Shaun Griffith is a 61 y.o. male is here for an endoscopy    Past Medical History:  Diagnosis Date   Anxiety    Depression    Hypertension     Past Surgical History:  Procedure Laterality Date   BACK SURGERY  1990   HNP L5   COLONOSCOPY     COLONOSCOPY WITH PROPOFOL N/A 05/13/2017   Procedure: COLONOSCOPY WITH PROPOFOL;  Surgeon: Jonathon Bellows, MD;  Location: [facility] ENDOSCOPY;  Service: Gastroenterology;  Laterality: N/A;   HERNIA REPAIR  1984   right groin   KNEE SURGERY Left 1992    Prior to Admission medications   Medication Sig Start Date End Date Taking? Authorizing Provider  lisinopril-hydrochlorothiazide (ZESTORETIC) 10-12.5 MG tablet Take 1 tablet by mouth daily. 12/17/21  Yes [name], FNP  metoprolol succinate (TOPROL-XL) 50 MG 24 hr tablet Take 1 tablet (50 mg total) by mouth daily. Take with or immediately following a meal. 09/16/21 09/11/22 Yes Tally [name] T, FNP  Azelastine-Fluticasone [facility]) 137-50 MCG/ACT SUSP One spray per nostril 2x daily. 11/17/19   Trinna Post, PA-C  fluticasone (FLONASE) 50 MCG/ACT nasal spray Place 2 sprays into both nostrils daily. 09/16/21   [name], FNP  gabapentin (NEURONTIN) 300 MG capsule TAKE ONE (1) CAPSULE BY MOUTH IN THE MORNING, ONE (1) IN THE AFTERNOON, AND TWO (2) AT BEDTIME 10/21/21   [name], FNP  LORazepam (ATIVAN) 0.5 MG tablet One tablet daily as needed for anxiety 09/16/21   [name], FNP  lubiprostone (AMITIZA) 8 MCG capsule Take 1 capsule (8 mcg total) by mouth daily with breakfast. 11/13/21   [name], FNP  omeprazole (PRILOSEC) 40 MG capsule Take 1 capsule (40 mg total) by mouth in the morning and at bedtime. 04/09/21 11/13/21  Jonathon Bellows,  MD  rosuvastatin (CRESTOR) 10 MG tablet Take 1 tablet (10 mg total) by mouth daily. 11/14/21   [name], FNP  sertraline (ZOLOFT) 50 MG tablet Take 1 tablet (50 mg total) by mouth daily. 09/16/21   [name], FNP  traZODone (DESYREL) 50 MG tablet Take 1 tablet (50 mg total) by mouth at bedtime. 09/16/21   [name], FNP    Allergies as of 12/16/2021 - Review Complete 11/13/2021  Allergen Reaction Noted   Sulfadimethoxine Nausea Only 10/22/2014    Family History  Adopted: Yes  Problem Relation Age of Onset   Diabetes Mother    Depression Mother    Cancer Father        skin and blood    Social History   Socioeconomic History   Marital status: Divorced    Spouse name: Not on file   Number of children: Not on file   Years of education: Not on file   Highest education level: Not on file  Occupational History   Not on file  Tobacco Use   Smoking status: Some Days    Types: Cigarettes   Smokeless tobacco: Never  Vaping Use   Vaping Use: Never used  Substance and Sexual Activity   Alcohol use: Yes    Alcohol/week: 12.0 standard drinks of alcohol    Types: 12 Cans of beer per week  Comment: Moderate alcohol use, drinks beer 3-4 days a week; drinks 8-10 beers   Drug use: No   Sexual activity: Yes  Other Topics Concern   Not on file  Social History Narrative   Not on file   Social Determinants of Health   Financial Resource Strain: Not on file  Food Insecurity: Not on file  Transportation Needs: Not on file  Physical Activity: Not on file  Stress: Not on file  Social Connections: Not on file  Intimate Partner Violence: Not on file    Review of Systems: See HPI, otherwise negative ROS  Physical Exam: BP (!) 140/89   Pulse 73   Temp (!) 96.7 F (35.9 C) (Temporal)   Resp 18   Ht '6\' 2"'$  (0.48 m)   Wt 120.2 kg   SpO2 100%   BMI 34.02 kg/m  General:   Alert,  pleasant and cooperative in NAD Head:  Normocephalic and atraumatic. Neck:  Supple; no  masses or thyromegaly. Lungs:  Clear throughout to auscultation, normal respiratory effort.    Heart:  +S1, +S2, Regular rate and rhythm, No edema. Abdomen:  Soft, nontender and nondistended. Normal bowel sounds, without guarding, and without rebound.   Neurologic:  Alert and  oriented x4;  grossly normal neurologically.  Impression/Plan: Shaun Griffith is here for an endoscopy  to be performed for  evaluation of abnormal ct scan findings    Risks, benefits, limitations, and alternatives regarding endoscopy have been reviewed with the patient.  Questions have been answered.  All parties agreeable.   Jonathon Bellows, MD  12/24/2021, 8:46 AM

## 2021-12-24 NOTE — Anesthesia Preprocedure Evaluation (Signed)
Anesthesia Evaluation  Patient identified by MRN, date of birth, ID band Patient awake    Reviewed: Allergy & Precautions, H&P , NPO status , Patient's Chart, lab work & pertinent test results, reviewed documented beta blocker date and time   Airway Mallampati: II   Neck ROM: full    Dental  (+) Poor Dentition   Pulmonary sleep apnea and Continuous Positive Airway Pressure Ventilation , Current Smoker and Patient abstained from smoking.,    Pulmonary exam normal        Cardiovascular Exercise Tolerance: Poor hypertension, On Medications negative cardio ROS Normal cardiovascular exam Rhythm:regular Rate:Normal     Neuro/Psych PSYCHIATRIC DISORDERS Anxiety Depression negative neurological ROS     GI/Hepatic negative GI ROS, Neg liver ROS,   Endo/Other  negative endocrine ROS  Renal/GU negative Renal ROS  negative genitourinary   Musculoskeletal   Abdominal   Peds  Hematology negative hematology ROS (+)   Anesthesia Other Findings Past Medical History: No date: Anxiety No date: Depression No date: Hypertension Past Surgical History: 1990: BACK SURGERY     Comment:  HNP L5 No date: COLONOSCOPY 05/13/2017: COLONOSCOPY WITH PROPOFOL; N/A     Comment:  Procedure: COLONOSCOPY WITH PROPOFOL;  Surgeon: Jonathon Bellows, MD;  Location: [facility] ENDOSCOPY;  Service:               Gastroenterology;  Laterality: N/A; 1984: HERNIA REPAIR     Comment:  right groin 1992: KNEE SURGERY; Left BMI    Body Mass Index: 34.02 kg/m     Reproductive/Obstetrics negative OB ROS                             Anesthesia Physical Anesthesia Plan  ASA: 3  Anesthesia Plan: General   Post-op Pain Management:    Induction:   PONV Risk Score and Plan:   Airway Management Planned:   Additional Equipment:   Intra-op Plan:   Post-operative Plan:   Informed Consent: I have reviewed the patients  History and Physical, chart, labs and discussed the procedure including the risks, benefits and alternatives for the proposed anesthesia with the patient or authorized representative who has indicated his/her understanding and acceptance.     Dental Advisory Given  Plan Discussed with: CRNA  Anesthesia Plan Comments:         Anesthesia Quick Evaluation

## 2021-12-24 NOTE — Transfer of Care (Signed)
Immediate Anesthesia Transfer of Care Note  Patient: [name]  Procedure(s) Performed: ESOPHAGOGASTRODUODENOSCOPY (EGD) WITH PROPOFOL  Patient Location: PACU  Anesthesia Type:General  Level of Consciousness: awake, alert  and oriented  Airway & Oxygen Therapy: Patient Spontanous Breathing  Post-op Assessment: Report given to RN and Post -op Vital signs reviewed and stable  Post vital signs: Reviewed and stable  Last Vitals:  Vitals Value Taken Time  BP 100/49 12/24/21 0905  Temp 36.2 C 12/24/21 0904  Pulse 75 12/24/21 0905  Resp 17 12/24/21 0905  SpO2 97 % 12/24/21 0905  Vitals shown include unvalidated device data.  Last Pain:  Vitals:   12/24/21 0904  TempSrc: Oral  PainSc: 0-No pain         Complications: No notable events documented.

## 2021-12-24 NOTE — Op Note (Signed)
[facility] Patient Name: Shaun Griffith Procedure Date: 12/24/2021 8:47 AM MRN: [id number] Account #: [id number] Date of Birth: [birth year] Admit Type: Outpatient Age: 61 Room: [facility] ENDO ROOM 3 Gender: Male Note Status: Finalized Instrument Name: Upper Endoscope [id number] Procedure:             Upper GI endoscopy Indications:           Dysphagia, Abnormal CT of the GI tract Providers:             Jonathon Bellows MD, MD Referring MD:          No Local Md, MD (Referring MD) Medicines:             Monitored Anesthesia Care Complications:         No immediate complications. Procedure:             Pre-Anesthesia Assessment:                        - Prior to the procedure, a History and Physical was                         performed, and patient medications, allergies and                         sensitivities were reviewed. The patient's tolerance                         of previous anesthesia was reviewed.                        - The risks and benefits of the procedure and the                         sedation options and risks were discussed with the                         patient. All questions were answered and informed                         consent was obtained.                        - ASA Grade Assessment: II - A patient with mild                         systemic disease.                        After obtaining informed consent, the endoscope was                         passed under direct vision. Throughout the procedure,                         the patient's blood pressure, pulse, and oxygen                         saturations were monitored continuously. The Endoscope  was introduced through the mouth, and advanced to the                         third part of duodenum. The upper GI endoscopy was                         accomplished with ease. The patient tolerated the                         procedure well. Findings:       The stomach was normal.      The examined duodenum was normal.      The cardia and gastric fundus were normal on retroflexion.      The examined esophagus was normal. Biopsies were taken with a cold       forceps for histology. Impression:            - Normal stomach.                        - Normal examined duodenum.                        - Normal esophagus. Biopsied. Recommendation:        - Await pathology results.                        - Discharge patient to home (with escort).                        - Resume previous diet.                        - Continue present medications.                        - Return to my office as previously scheduled. Procedure Code(s):     --- Professional ---                        [phone], Esophagogastroduodenoscopy, flexible,                         transoral; with biopsy, single or multiple Diagnosis Code(s):     --- Professional ---                        R13.10, Dysphagia, unspecified                        R93.3, Abnormal findings on diagnostic imaging of                         other parts of digestive tract CPT copyright 2019 American Medical Association. All rights reserved. The codes documented in this report are preliminary and upon coder review may  be revised to meet current compliance requirements. Jonathon Bellows, MD Jonathon Bellows MD, MD 12/24/2021 9:01:00 AM This report has been signed electronically. Number of Addenda: 0 Note Initiated On: 12/24/2021 8:47 AM Estimated Blood Loss:  Estimated blood loss: none.      [facility]

## 2021-12-24 NOTE — Anesthesia Procedure Notes (Signed)
Date/Time: 12/24/2021 8:53 AM  Performed by: Johnna Acosta, CRNAPre-anesthesia Checklist: Patient identified, Emergency Drugs available, Suction available, Patient being monitored and Timeout performed Patient Re-evaluated:Patient Re-evaluated prior to induction Oxygen Delivery Method: Nasal cannula Preoxygenation: Pre-oxygenation with 100% oxygen Induction Type: IV induction

## 2021-12-25 ENCOUNTER — Encounter: Payer: Self-pay | Admitting: Gastroenterology

## 2021-12-25 LAB — SURGICAL PATHOLOGY

## 2021-12-25 NOTE — Anesthesia Postprocedure Evaluation (Signed)
Anesthesia Post Note  Patient: Shaun Griffith  Procedure(s) Performed: ESOPHAGOGASTRODUODENOSCOPY (EGD) WITH PROPOFOL  Patient location during evaluation: PACU Anesthesia Type: General Level of consciousness: awake and alert Pain management: pain level controlled Vital Signs Assessment: post-procedure vital signs reviewed and stable Respiratory status: spontaneous breathing, nonlabored ventilation, respiratory function stable and patient connected to nasal cannula oxygen Cardiovascular status: blood pressure returned to baseline and stable Postop Assessment: no apparent nausea or vomiting Anesthetic complications: no   No notable events documented.   Last Vitals:  Vitals:   12/24/21 0904 12/24/21 0915  BP: (!) 100/49 123/71  Pulse: 77 71  Resp: (!) 23 16  Temp: (!) 36.2 C   SpO2: 98% 99%    Last Pain:  Vitals:   12/24/21 0915  TempSrc:   PainSc: 0-No pain                 Molli Barrows

## 2022-01-01 ENCOUNTER — Encounter: Payer: Self-pay | Admitting: Gastroenterology

## 2022-01-08 ENCOUNTER — Other Ambulatory Visit: Payer: Self-pay

## 2022-01-08 ENCOUNTER — Encounter: Payer: Self-pay | Admitting: Gastroenterology

## 2022-01-08 ENCOUNTER — Ambulatory Visit: Payer: 59 | Admitting: Gastroenterology

## 2022-01-08 VITALS — BP 146/87 | HR 92 | Temp 98.1°F | Wt 271.0 lb

## 2022-01-08 DIAGNOSIS — R9389 Abnormal findings on diagnostic imaging of other specified body structures: Secondary | ICD-10-CM

## 2022-01-08 DIAGNOSIS — R1032 Left lower quadrant pain: Secondary | ICD-10-CM

## 2022-01-08 DIAGNOSIS — K5900 Constipation, unspecified: Secondary | ICD-10-CM | POA: Diagnosis not present

## 2022-01-08 MED ORDER — NA SULFATE-K SULFATE-MG SULF 17.5-3.13-1.6 GM/177ML PO SOLN: 354.0000 mL | Freq: Once | ORAL | 0 refills | Status: AC

## 2022-01-08 NOTE — Progress Notes (Signed)
Patient stated that he would need to speak to his sister and then he will call me to give me dates that she will be available to bring him.

## 2022-01-08 NOTE — Progress Notes (Signed)
Jonathon Bellows MD, MRCP(U.K) [address]  [place]  [place], [place] [address]  Main: [phone]  Fax: [phone]   Primary Care Physician: [name], FNP  Primary Gastroenterologist:  Dr. Jonathon Bellows   Chief Complaint  Patient presents with   Go over EGD results    HPI: Shaun Griffith is a 61 y.o. male   Summary of history :  Initially referred and seen in February 2023 for diverticulitis and abnormal appearance of the esophagus on the CT scan..  CT scan of the abdomen on 04/06/2021 showed wall thickening of the esophagus with retained fluid contrast in the esophagus suggestive of esophagitis however underlying mass cannot be excluded colonic diverticulosis and moderate stool from throughout the colon mild avascular necrosis left femoral head     He states that he has had left-sided abdominal pain for a few months.  On and off through the day.  Worse before a bowel movement.  He denies any constipation.  He states he has a bowel movement daily.  Denies any heartburn.  Denies any dysphagia.  Denies any weight loss.  Denies any reflux-like symptoms.   Interval history 04/09/2021-01/08/2022 12/24/2021: EGD: Normal biopsies esophagus was normal Denies any upper GI symptoms still complains of left lower quadrant discomfort usually associated when he has a lot of saliva production and he swallows her saliva as a field of case she has distention denies any constipation previously had issues with constipation prescribed Amitiza which caused him to have diarrhea takes it once daily with good bowel movements on a daily basis.  Current Outpatient Medications  Medication Sig Dispense Refill   dicyclomine (BENTYL) 10 MG capsule Take 1 capsule (10 mg total) by mouth 4 (four) times daily -  before meals and at bedtime. 360 capsule 0   fluticasone (FLONASE) 50 MCG/ACT nasal spray Place 2 sprays into both nostrils daily. 16 g 6   gabapentin (NEURONTIN) 300 MG capsule TAKE ONE (1)  CAPSULE BY MOUTH IN THE MORNING, ONE (1) IN THE AFTERNOON, AND TWO (2) AT BEDTIME 120 capsule 0   lisinopril-hydrochlorothiazide (ZESTORETIC) 10-12.5 MG tablet Take 1 tablet by mouth daily. 90 tablet 3   LORazepam (ATIVAN) 0.5 MG tablet One tablet daily as needed for anxiety 30 tablet 5   lubiprostone (AMITIZA) 8 MCG capsule Take 1 capsule (8 mcg total) by mouth daily with breakfast. 60 capsule 2   metoprolol succinate (TOPROL-XL) 50 MG 24 hr tablet Take 1 tablet (50 mg total) by mouth daily. Take with or immediately following a meal. 90 tablet 3   omeprazole (PRILOSEC) 40 MG capsule Take 1 capsule (40 mg total) by mouth in the morning and at bedtime. 90 capsule 3   rosuvastatin (CRESTOR) 10 MG tablet Take 1 tablet (10 mg total) by mouth daily. 90 tablet 1   sertraline (ZOLOFT) 50 MG tablet Take 1 tablet (50 mg total) by mouth daily. 90 tablet 1   traZODone (DESYREL) 50 MG tablet Take 1 tablet (50 mg total) by mouth at bedtime. 90 tablet 3   No current facility-administered medications for this visit.    Allergies as of 01/08/2022 - Review Complete 01/08/2022  Allergen Reaction Noted   Sulfadimethoxine Nausea Only 10/22/2014    ROS:  General: Negative for anorexia, weight loss, fever, chills, fatigue, weakness. ENT: Negative for hoarseness, difficulty swallowing , nasal congestion. CV: Negative for chest pain, angina, palpitations, dyspnea on exertion, peripheral edema.  Respiratory: Negative for dyspnea at rest, dyspnea on exertion,  cough, sputum, wheezing.  GI: See history of present illness. GU:  Negative for dysuria, hematuria, urinary incontinence, urinary frequency, nocturnal urination.  Endo: Negative for unusual weight change.    Physical Examination:   BP (!) 146/87   Pulse 92   Temp 98.1 F (36.7 C) (Oral)   Wt 271 lb (122.9 kg)   BMI 34.79 kg/m   General: Well-nourished, well-developed in no acute distress.  Eyes: No icterus. Conjunctivae pink. Neuro: Alert and  oriented x 3.  Grossly intact. Skin: Warm and dry, no jaundice.   Psych: Alert and cooperative, normal mood and affect.   Imaging Studies: No results found.  Assessment and Plan:   Shaun Griffith is a 61 y.o. y/o male here to follow-up for abnormal appearance of the CT scan and IBS constipation.  Presently the constipation is well controlled on Amitiza once a week.  Very likely had a esophagitis which showed the abnormal appearance of the esophagus on the CAT scan which has responded to treatment with a PPI likely has underlying.  Likely left lower quadrant discomfort is related to aerophagia as he has a history of swallowing a lot of saliva versus IBS constipation.    Plan 1.  Prilosec 40 mg twice daily to be continued we will reduce the dose at the next visit 2.  Continue Linzess for constipation the way he is taking 3.  Been over 4 years since his last colonoscopy we will perform a colonoscopy to rule out any abnormalities in the left lower quadrant   I have discussed alternative options, risks & benefits,  which include, but are not limited to, bleeding, infection, perforation,respiratory complication & drug reaction.  The patient agrees with this plan & written consent will be obtained.     Dr Jonathon Bellows  MD,MRCP [facility]) Follow up in 3 months

## 2022-01-08 NOTE — Patient Instructions (Addendum)
Please take 1 capsule of charcoal capsules daily.  Charcoal tablets or capsules What is this medication? CHARCOAL (CHAR kole) is a dietary supplement. It is used to absorb gases in the stomach that cause stomach gas. Do not use this supplement to treat poisonings or overdose. The FDA has not approved this supplement for any medical use. This medicine may be used for other purposes; ask your health care provider or pharmacist if you have questions. This medicine may be used for other purposes; ask your health care provider or pharmacist if you have questions. COMMON BRAND NAME(S): Charcoal Plus DS, CharcoCap, CharcoCaps Anti-Gas What should I tell my care team before I take this medication? They need to know if you have any of these conditions: food or medicine poisoning have frequent heartburn or gas have recently traveled to another country stomach or intestinal disease an unusual or allergic reaction to charcoal, other medicines, foods, dyes, or preservatives pregnant or trying to get pregnant breast-feeding How should I use this medication? Take by mouth with a glass of water. Follow the directions on the package label or use as directed by a health care provider. Do not take this medicine more often than directed. Talk to your pediatrician regarding the use of this medicine in children. Special care may be needed. Overdosage: If you think you have taken too much of this medicine contact a poison control center or emergency room at once. NOTE: This medicine is only for you. Do not share this medicine with others. Overdosage: If you think you have taken too much of this medicine contact a poison control center or emergency room at once. NOTE: This medicine is only for you. Do not share this medicine with others. What if I miss a dose? If you miss a dose, take it as soon as you can. If it is almost time for your next dose, take only that dose. Do not take double or extra doses. What may  interact with this medication? Do not take this medicine with any of the following medications: ipecac This medicine may also interact with the following medications: acarbose aripiprazole birth control pills carbamazepine dapsone digoxin olanzapine phenothiazines like chlorpromazine, mesoridazine, prochlorperazine, thioridazine phenytoin pindolol some herbal medicines or dietary supplements theophylline ursodeoxycholic acid This list may not describe all possible interactions. Give your health care provider a list of all the medicines, herbs, non-prescription drugs, or dietary supplements you use. Also tell them if you smoke, drink alcohol, or use illegal drugs. Some items may interact with your medicine. This list may not describe all possible interactions. Give your health care provider a list of all the medicines, herbs, non-prescription drugs, or dietary supplements you use. Also tell them if you smoke, drink alcohol, or use illegal drugs. Some items may interact with your medicine. What should I watch for while using this medication? Tell your doctor or healthcare professional if your symptoms do not start to get better or if they get worse. See your doctor if your symptoms last for 3 days. Do not use this medicine to treat a poisoning or overdose. Get emergency help. This medicine may bind to other medicines or dairy products in the stomach. Do not take any other medicines for at least 2 hours before or after taking this medicine. Do not eat or drink milk, cheese, or other diary for at least 2 hours before or after taking this medicine. Herbal or dietary supplements are not regulated like medicines. Rigid quality control standards are not required for  dietary supplements. The purity and strength of these products can vary. The safety and effect of this dietary supplement for a certain disease or illness is not well known. This product is not intended to diagnose, treat, cure or prevent  any disease. The Food and Drug Administration suggests the following to help consumers protect themselves: Always read product labels and follow directions. Natural does not mean a product is safe for humans to take. Look for products that include USP after the ingredient name. This means that the manufacturer followed the standards of the [place] Pharmacopoeia. Supplements made or sold by a nationally known food or drug company are more likely to be made under tight controls. You can write to the company for more information about how the product was made. What side effects may I notice from receiving this medication? Side effects that you should report to your doctor or health care professional as soon as possible: allergic reactions like skin rash, itching or hives, swelling of the face, lips, or tongue Side effects that usually do not require medical attention (report to your doctor or health care professional if they continue or are bothersome): constipation dark stools dark tongue diarrhea or vomiting This list may not describe all possible side effects. Call your doctor for medical advice about side effects. You may report side effects to FDA at 1-800-FDA-1088. This list may not describe all possible side effects. Call your doctor for medical advice about side effects. You may report side effects to FDA at 1-800-FDA-1088. Where should I keep my medication? Keep out of the reach of children. Store at room temperature between 15 and 30 degrees C (59 and 86 degrees F). Protect from heat and moisture. Keep tightly closed. Throw away any unused medicine after the expiration date. NOTE: This sheet is a summary. It may not cover all possible information. If you have questions about this medicine, talk to your doctor, pharmacist, or health care provider.  2023 Elsevier/Gold Standard (2005-09-28 00:00:00)

## 2022-01-09 ENCOUNTER — Encounter: Payer: Self-pay | Admitting: Family Medicine

## 2022-01-10 ENCOUNTER — Other Ambulatory Visit: Payer: Self-pay | Admitting: Family Medicine

## 2022-01-10 MED ORDER — LOSARTAN POTASSIUM-HCTZ 50-12.5 MG PO TABS: 1.0000 | ORAL_TABLET | Freq: Every day | ORAL | 2 refills | Status: DC

## 2022-01-19 ENCOUNTER — Other Ambulatory Visit: Payer: Self-pay | Admitting: Family Medicine

## 2022-01-19 DIAGNOSIS — Z125 Encounter for screening for malignant neoplasm of prostate: Secondary | ICD-10-CM

## 2022-01-19 MED ORDER — GABAPENTIN 300 MG PO CAPS: ORAL_CAPSULE | ORAL | 0 refills | Status: DC

## 2022-02-03 ENCOUNTER — Encounter: Payer: Self-pay | Admitting: Gastroenterology

## 2022-02-04 ENCOUNTER — Other Ambulatory Visit: Payer: Self-pay

## 2022-02-04 DIAGNOSIS — R1032 Left lower quadrant pain: Secondary | ICD-10-CM

## 2022-02-04 DIAGNOSIS — K5900 Constipation, unspecified: Secondary | ICD-10-CM

## 2022-02-17 ENCOUNTER — Ambulatory Visit: Payer: 59 | Admitting: Anesthesiology

## 2022-02-17 ENCOUNTER — Ambulatory Visit
Admission: RE | Admit: 2022-02-17 | Discharge: 2022-02-17 | Disposition: A | Discharge status: Home / Self Care | Payer: 59 | Source: Ambulatory Visit | Attending: Gastroenterology | Admitting: Gastroenterology

## 2022-02-17 ENCOUNTER — Encounter
Admission: RE | Disposition: A | Discharge status: Home / Self Care | Payer: Self-pay | Source: Ambulatory Visit | Attending: Gastroenterology

## 2022-02-17 ENCOUNTER — Other Ambulatory Visit: Payer: Self-pay

## 2022-02-17 ENCOUNTER — Encounter: Payer: Self-pay | Admitting: Gastroenterology

## 2022-02-17 DIAGNOSIS — Z79899 Other long term (current) drug therapy: Secondary | ICD-10-CM | POA: Insufficient documentation

## 2022-02-17 DIAGNOSIS — F419 Anxiety disorder, unspecified: Secondary | ICD-10-CM | POA: Insufficient documentation

## 2022-02-17 DIAGNOSIS — K621 Rectal polyp: Secondary | ICD-10-CM | POA: Insufficient documentation

## 2022-02-17 DIAGNOSIS — K5900 Constipation, unspecified: Secondary | ICD-10-CM | POA: Insufficient documentation

## 2022-02-17 DIAGNOSIS — K219 Gastro-esophageal reflux disease without esophagitis: Secondary | ICD-10-CM | POA: Insufficient documentation

## 2022-02-17 DIAGNOSIS — G4730 Sleep apnea, unspecified: Secondary | ICD-10-CM | POA: Insufficient documentation

## 2022-02-17 DIAGNOSIS — K640 First degree hemorrhoids: Secondary | ICD-10-CM | POA: Diagnosis not present

## 2022-02-17 DIAGNOSIS — F17200 Nicotine dependence, unspecified, uncomplicated: Secondary | ICD-10-CM | POA: Diagnosis not present

## 2022-02-17 DIAGNOSIS — D126 Benign neoplasm of colon, unspecified: Secondary | ICD-10-CM | POA: Diagnosis not present

## 2022-02-17 DIAGNOSIS — F32A Depression, unspecified: Secondary | ICD-10-CM | POA: Diagnosis not present

## 2022-02-17 DIAGNOSIS — R1032 Left lower quadrant pain: Secondary | ICD-10-CM | POA: Diagnosis present

## 2022-02-17 HISTORY — DX: Sleep apnea, unspecified: G47.30

## 2022-02-17 HISTORY — PX: COLONOSCOPY WITH PROPOFOL: SHX5780

## 2022-02-17 HISTORY — DX: Irritable bowel syndrome, unspecified: K58.9

## 2022-02-17 HISTORY — DX: Gastro-esophageal reflux disease without esophagitis: K21.9

## 2022-02-17 SURGERY — COLONOSCOPY WITH PROPOFOL
Anesthesia: General

## 2022-02-17 MED ORDER — PROPOFOL 10 MG/ML IV BOLUS
INTRAVENOUS | Status: DC | PRN
  Administered 2022-02-17: 80 mg via INTRAVENOUS
  Administered 2022-02-17: 20 mg via INTRAVENOUS

## 2022-02-17 MED ORDER — EPHEDRINE SULFATE (PRESSORS) 50 MG/ML IJ SOLN
INTRAMUSCULAR | Status: DC | PRN
  Administered 2022-02-17 (×2): 10 mg via INTRAVENOUS

## 2022-02-17 MED ORDER — PHENYLEPHRINE HCL (PRESSORS) 10 MG/ML IV SOLN
INTRAVENOUS | Status: DC | PRN
  Administered 2022-02-17: 80 ug via INTRAVENOUS

## 2022-02-17 MED ORDER — GLYCOPYRROLATE 0.2 MG/ML IJ SOLN
INTRAMUSCULAR | Status: DC | PRN
  Administered 2022-02-17: .2 mg via INTRAVENOUS

## 2022-02-17 MED ORDER — PROPOFOL 500 MG/50ML IV EMUL
INTRAVENOUS | Status: DC | PRN
  Administered 2022-02-17: 165 ug/kg/min via INTRAVENOUS

## 2022-02-17 MED ORDER — SODIUM CHLORIDE 0.9 % IV SOLN: INTRAVENOUS | Status: DC

## 2022-02-17 MED ORDER — LIDOCAINE HCL (CARDIAC) PF 100 MG/5ML IV SOSY
PREFILLED_SYRINGE | INTRAVENOUS | Status: DC | PRN
  Administered 2022-02-17: 100 mg via INTRAVENOUS

## 2022-02-17 NOTE — Transfer of Care (Signed)
Immediate Anesthesia Transfer of Care Note  Patient: Shaun Griffith  Procedure(s) Performed: COLONOSCOPY WITH PROPOFOL  Patient Location: Endoscopy Unit  Anesthesia Type:General  Level of Consciousness: drowsy and patient cooperative  Airway & Oxygen Therapy: Patient Spontanous Breathing and Patient connected to face mask oxygen  Post-op Assessment: Report given to RN and Post -op Vital signs reviewed and stable  Post vital signs: Reviewed and stable  Last Vitals:  Vitals Value Taken Time  BP 81/54 02/17/22 0903  Temp 35.8 C 02/17/22 0903  Pulse    Resp    SpO2 99 % 02/17/22 0903    Last Pain:  Vitals:   02/17/22 0903  TempSrc: Temporal  PainSc: Asleep         Complications: No notable events documented.

## 2022-02-17 NOTE — H&P (Signed)
Jonathon Bellows, MD [address], [place], [place], [place], [address] [address], [place], [place], [place], [address] Phone: [phone]  Fax: [phone]  Primary Care Physician:  [name], FNP   Pre-Procedure History & Physical: HPI:  Shaun Griffith is a 61 y.o. male is here for an colonoscopy.   Past Medical History:  Diagnosis Date   Anxiety    Depression    GERD (gastroesophageal reflux disease)    Hypertension    IBS (irritable bowel syndrome)    Sleep apnea     Past Surgical History:  Procedure Laterality Date   BACK SURGERY  1990   HNP L5   COLONOSCOPY     COLONOSCOPY WITH PROPOFOL N/A 05/13/2017   Procedure: COLONOSCOPY WITH PROPOFOL;  Surgeon: Jonathon Bellows, MD;  Location: [facility] ENDOSCOPY;  Service: Gastroenterology;  Laterality: N/A;   ESOPHAGOGASTRODUODENOSCOPY (EGD) WITH PROPOFOL N/A 12/24/2021   Procedure: ESOPHAGOGASTRODUODENOSCOPY (EGD) WITH PROPOFOL;  Surgeon: Jonathon Bellows, MD;  Location: [facility] ENDOSCOPY;  Service: Gastroenterology;  Laterality: N/A;   HERNIA REPAIR  1984   right groin   KNEE SURGERY Left 1992    Prior to Admission medications   Medication Sig Start Date End Date Taking? Authorizing Provider  dicyclomine (BENTYL) 10 MG capsule Take 1 capsule (10 mg total) by mouth 4 (four) times daily -  before meals and at bedtime. 12/24/21 03/26/22 Yes Jonathon Bellows, MD  losartan-hydrochlorothiazide [facility]) 50-12.5 MG tablet Take 1 tablet by mouth daily. 01/10/22  Yes [name], FNP  metoprolol succinate (TOPROL-XL) 50 MG 24 hr tablet Take 1 tablet (50 mg total) by mouth daily. Take with or immediately following a meal. 09/16/21 09/11/22 Yes [name], FNP  rosuvastatin (CRESTOR) 10 MG tablet Take 1 tablet (10 mg total) by mouth daily. 11/14/21  Yes [name], FNP  sertraline (ZOLOFT) 50 MG tablet Take 1 tablet (50 mg total) by mouth daily. 09/16/21  Yes [name], FNP  traZODone (DESYREL) 50 MG tablet Take 1 tablet (50 mg total)  by mouth at bedtime. 09/16/21  Yes [name], FNP  fluticasone (FLONASE) 50 MCG/ACT nasal spray Place 2 sprays into both nostrils daily. 09/16/21   [name], FNP  gabapentin (NEURONTIN) 300 MG capsule TAKE ONE (1) CAPSULE BY MOUTH IN THE MORNING, ONE (1) IN THE AFTERNOON, AND TWO (2) AT BEDTIME 01/19/22   [name], FNP  LORazepam (ATIVAN) 0.5 MG tablet One tablet daily as needed for anxiety 09/16/21   [name], FNP  lubiprostone (AMITIZA) 8 MCG capsule Take 1 capsule (8 mcg total) by mouth daily with breakfast. 11/13/21   [name], FNP  omeprazole (PRILOSEC) 40 MG capsule Take 1 capsule (40 mg total) by mouth in the morning and at bedtime. 04/09/21 01/08/22  Jonathon Bellows, MD    Allergies as of 02/04/2022 - Review Complete 01/08/2022  Allergen Reaction Noted   Sulfadimethoxine Nausea Only 10/22/2014    Family History  Adopted: Yes  Problem Relation Age of Onset   Diabetes Mother    Depression Mother    Cancer Father        skin and blood    Social History   Socioeconomic History   Marital status: Divorced    Spouse name: Not on file   Number of children: Not on file   Years of education: Not on file   Highest education level: Not on file  Occupational History   Not on file  Tobacco Use   Smoking status: Some Days    Types: Cigarettes   Smokeless tobacco: Never  Vaping Use   Vaping Use: Never used  Substance and Sexual Activity   Alcohol use: Yes    Alcohol/week: 12.0 standard drinks of alcohol    Types: 12 Cans of beer per week    Comment: Moderate alcohol use, drinks beer 3-4 days a week; drinks 8-10 beers.NO ETOH IN 1 WEEK   Drug use: No   Sexual activity: Yes  Other Topics Concern   Not on file  Social History Narrative   Not on file   Social Determinants of Health   Financial Resource Strain: Not on file  Food Insecurity: Not on file  Transportation Needs: Not on file  Physical Activity: Not on file  Stress: Not on file  Social  Connections: Not on file  Intimate Partner Violence: Not on file    Review of Systems: See HPI, otherwise negative ROS  Physical Exam: BP (!) 147/93   Pulse 73   Temp (!) 97.1 F (36.2 C) (Temporal)   Resp 19   Ht '6\' 3"'$  (1.905 m)   Wt 121.3 kg   SpO2 97%   BMI 33.42 kg/m  General:   Alert,  pleasant and cooperative in NAD Head:  Normocephalic and atraumatic. Neck:  Supple; no masses or thyromegaly. Lungs:  Clear throughout to auscultation, normal respiratory effort.    Heart:  +S1, +S2, Regular rate and rhythm, No edema. Abdomen:  Soft, nontender and nondistended. Normal bowel sounds, without guarding, and without rebound.   Neurologic:  Alert and  oriented x4;  grossly normal neurologically.  Impression/Plan: Shaun Griffith is here for an colonoscopy to be performed for abdominal pain Risks, benefits, limitations, and alternatives regarding  colonoscopy have been reviewed with the patient.  Questions have been answered.  All parties agreeable.   Jonathon Bellows, MD  02/17/2022, 8:28 AM

## 2022-02-17 NOTE — Anesthesia Postprocedure Evaluation (Signed)
Anesthesia Post Note  Patient: Shaun Griffith  Procedure(s) Performed: COLONOSCOPY WITH PROPOFOL  Patient location during evaluation: Endoscopy Anesthesia Type: General Level of consciousness: awake and alert Pain management: pain level controlled Vital Signs Assessment: post-procedure vital signs reviewed and stable Respiratory status: spontaneous breathing, nonlabored ventilation, respiratory function stable and patient connected to nasal cannula oxygen Cardiovascular status: blood pressure returned to baseline and stable Postop Assessment: no apparent nausea or vomiting Anesthetic complications: no  No notable events documented.   Last Vitals:  Vitals:   02/17/22 0903 02/17/22 0905  BP: (!) 81/54 122/78  Pulse:  64  Resp:  15  Temp: (!) 35.8 C   SpO2: 99% 100%    Last Pain:  Vitals:   02/17/22 0903  TempSrc: Temporal  PainSc: Asleep                 Ilene Qua

## 2022-02-17 NOTE — Op Note (Signed)
[facility] Gastroenterology Patient Name: Shaun Griffith Procedure Date: 02/17/2022 8:29 AM MRN: [id number] Account #: [id number] Date of Birth: 1960/04/16 Admit Type: Outpatient Age: 61 Room: [facility] ENDO ROOM 2 Gender: Male Note Status: Finalized Instrument Name: Park Meo [id number] Procedure:             Colonoscopy Indications:           Abdominal pain in the left lower quadrant Providers:             Jonathon Bellows MD, MD Referring MD:          No Local Md, MD (Referring MD) Medicines:             Monitored Anesthesia Care Complications:         No immediate complications. Procedure:             Pre-Anesthesia Assessment:                        - Prior to the procedure, a History and Physical was                         performed, and patient medications, allergies and                         sensitivities were reviewed. The patient's tolerance                         of previous anesthesia was reviewed.                        - The risks and benefits of the procedure and the                         sedation options and risks were discussed with the                         patient. All questions were answered and informed                         consent was obtained.                        - ASA Grade Assessment: II - A patient with mild                         systemic disease.                        After obtaining informed consent, the colonoscope was                         passed under direct vision. Throughout the procedure,                         the patient's blood pressure, pulse, and oxygen                         saturations were monitored continuously. The  Colonoscope was introduced through the anus and                         advanced to the the cecum, identified by the                         appendiceal orifice. The colonoscopy was performed                         with ease. The patient tolerated the procedure well.                          The quality of the bowel preparation was excellent.                         The ileocecal valve, appendiceal orifice, and rectum                         were photographed. Findings:      The perianal and digital rectal examinations were normal.      Non-bleeding internal hemorrhoids were found during retroflexion. The       hemorrhoids were large and Grade I (internal hemorrhoids that do not       prolapse).      Two sessile polyps were found in the rectum. The polyps were 3 to 4 mm       in size. These polyps were removed with a cold snare. Resection and       retrieval were complete.      The exam was otherwise without abnormality on direct and retroflexion       views. Impression:            - Non-bleeding internal hemorrhoids.                        - Two 3 to 4 mm polyps in the rectum, removed with a                         cold snare. Resected and retrieved.                        - The examination was otherwise normal on direct and                         retroflexion views. Recommendation:        - Discharge patient to home (with escort).                        - Resume previous diet.                        - Continue present medications.                        - Await pathology results.                        - Repeat colonoscopy for surveillance based on  pathology results. 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 All rights reserved. The codes documented in this report are preliminary and upon coder  review may  be revised to meet current compliance requirements. Jonathon Bellows, MD Jonathon Bellows MD, MD 02/17/2022 9:00:30 AM This report has been signed electronically. Number of Addenda: 0 Note Initiated On: 02/17/2022 8:29 AM Scope Withdrawal Time: 0 hours 9 minutes 55 seconds  Total Procedure Duration: 0 hours 15 minutes 24 seconds  Estimated Blood Loss:  Estimated blood loss: none.      [facility]

## 2022-02-17 NOTE — Anesthesia Procedure Notes (Signed)
Procedure Name: General with mask airway Date/Time: 02/17/2022 8:42 AM  Performed by: Kelton Pillar, CRNAPre-anesthesia Checklist: Patient identified, Emergency Drugs available, Suction available and Patient being monitored Patient Re-evaluated:Patient Re-evaluated prior to induction Oxygen Delivery Method: Simple face mask Induction Type: IV induction Airway Equipment and Method: Oral airway Placement Confirmation: breath sounds checked- equal and bilateral, CO2 detector and positive ETCO2 Dental Injury: Teeth and Oropharynx as per pre-operative assessment

## 2022-02-17 NOTE — Anesthesia Preprocedure Evaluation (Signed)
Anesthesia Evaluation  Patient identified by MRN, date of birth, ID band Patient awake    Reviewed: Allergy & Precautions, NPO status , Patient's Chart, lab work & pertinent test results  Airway Mallampati: III  TM Distance: >3 FB Neck ROM: full    Dental  (+) Poor Dentition   Pulmonary sleep apnea and Continuous Positive Airway Pressure Ventilation , Current Smoker and Patient abstained from smoking.   Pulmonary exam normal        Cardiovascular hypertension, On Medications negative cardio ROS Normal cardiovascular exam     Neuro/Psych  PSYCHIATRIC DISORDERS Anxiety Depression    negative neurological ROS     GI/Hepatic Neg liver ROS,,,  Endo/Other  negative endocrine ROS    Renal/GU negative Renal ROS  negative genitourinary   Musculoskeletal   Abdominal   Peds  Hematology negative hematology ROS (+)   Anesthesia Other Findings Past Medical History: No date: Anxiety No date: Depression No date: Hypertension  Past Surgical History: 1990: BACK SURGERY     Comment:  HNP L5 No date: COLONOSCOPY 05/13/2017: COLONOSCOPY WITH PROPOFOL; N/A     Comment:  Procedure: COLONOSCOPY WITH PROPOFOL;  Surgeon: Jonathon Bellows, MD;  Location: [facility] ENDOSCOPY;  Service:               Gastroenterology;  Laterality: N/A; 12/24/2021: ESOPHAGOGASTRODUODENOSCOPY (EGD) WITH PROPOFOL; N/A     Comment:  Procedure: ESOPHAGOGASTRODUODENOSCOPY (EGD) WITH               PROPOFOL;  Surgeon: Jonathon Bellows, MD;  Location: [facility]               ENDOSCOPY;  Service: Gastroenterology;  Laterality: N/A; 1984: HERNIA REPAIR     Comment:  right groin 1992: KNEE SURGERY; Left     Reproductive/Obstetrics negative OB ROS                             Anesthesia Physical Anesthesia Plan  ASA: 2  Anesthesia Plan: General   Post-op Pain Management: Minimal or no pain anticipated   Induction: Intravenous  PONV  Risk Score and Plan: Propofol infusion and TIVA  Airway Management Planned: Natural Airway and Nasal Cannula  Additional Equipment:   Intra-op Plan:   Post-operative Plan:   Informed Consent: I have reviewed the patients History and Physical, chart, labs and discussed the procedure including the risks, benefits and alternatives for the proposed anesthesia with the patient or authorized representative who has indicated his/her understanding and acceptance.     Dental Advisory Given  Plan Discussed with: Anesthesiologist, CRNA and Surgeon  Anesthesia Plan Comments: (Patient consented for risks of anesthesia including but not limited to:  - adverse reactions to medications - risk of airway placement if required - damage to eyes, teeth, lips or other oral mucosa - nerve damage due to positioning  - sore throat or hoarseness - Damage to heart, brain, nerves, lungs, other parts of body or loss of life  Patient voiced understanding.)       Anesthesia Quick Evaluation

## 2022-02-18 ENCOUNTER — Encounter: Payer: Self-pay | Admitting: Gastroenterology

## 2022-02-18 LAB — SURGICAL PATHOLOGY

## 2022-03-04 ENCOUNTER — Telehealth: Payer: Self-pay | Admitting: Gastroenterology

## 2022-03-04 NOTE — Telephone Encounter (Signed)
Patients sister called stating that the patient was very sick over the holiday. States he has severe stomach pain and nausea.

## 2022-03-05 ENCOUNTER — Other Ambulatory Visit: Payer: Self-pay | Admitting: Family Medicine

## 2022-03-05 ENCOUNTER — Encounter: Payer: Self-pay | Admitting: Gastroenterology

## 2022-03-05 DIAGNOSIS — F339 Major depressive disorder, recurrent, unspecified: Secondary | ICD-10-CM

## 2022-03-13 ENCOUNTER — Other Ambulatory Visit: Payer: Self-pay | Admitting: Family Medicine

## 2022-03-13 NOTE — Telephone Encounter (Signed)
Requested Prescriptions  Pending Prescriptions Disp Refills   losartan-hydrochlorothiazide (HYZAAR) 50-12.5 MG tablet [Pharmacy Med Name: LOSARTAN POTASSIUM-HCTZ 50-12.5 MG] 30 tablet 2    Sig: TAKE 1 TABLET BY MOUTH ONCE DAILY     Cardiovascular: ARB + Diuretic Combos Failed - 03/13/2022 11:42 AM      Failed - Last BP in normal range    BP Readings from Last 1 Encounters:  02/17/22 (!) 150/76         Passed - K in normal range and within 180 days    Potassium  Date Value Ref Range Status  11/13/2021 3.8 3.5 - 5.2 mmol/L Final         Passed - Na in normal range and within 180 days    Sodium  Date Value Ref Range Status  11/13/2021 139 134 - 144 mmol/L Final         Passed - Cr in normal range and within 180 days    Creat  Date Value Ref Range Status  12/31/2016 0.73 0.70 - 1.33 mg/dL Final    Comment:    For patients >23 years of age, the reference limit for Creatinine is approximately 13% higher for people identified as African-American. .    Creatinine, Ser  Date Value Ref Range Status  11/13/2021 0.90 0.76 - 1.27 mg/dL Final         Passed - eGFR is 10 or above and within 180 days    GFR, Est African American  Date Value Ref Range Status  12/31/2016 119 > OR = 60 mL/min/1.40m Final   GFR calc Af Amer  Date Value Ref Range Status  04/20/2019 117 >59 mL/min/1.73 Final   GFR, Est Non African American  Date Value Ref Range Status  12/31/2016 103 > OR = 60 mL/min/1.717mFinal   GFR calc non Af Amer  Date Value Ref Range Status  04/20/2019 102 >59 mL/min/1.73 Final   eGFR  Date Value Ref Range Status  11/13/2021 97 >59 mL/min/1.73 Final         Passed - Patient is not pregnant      Passed - Valid encounter within last 6 months    Recent Outpatient Visits           2 months ago Essential hypertension   [facility]   4 months ago [name]   5 months ago  [facility]  [facility]   1 year ago [facility] LLQ pain   [facility]   1 year ago [facility]   [facility] [place]

## 2022-04-02 ENCOUNTER — Other Ambulatory Visit: Payer: Self-pay | Admitting: Family Medicine

## 2022-04-02 DIAGNOSIS — Z125 Encounter for screening for malignant neoplasm of prostate: Secondary | ICD-10-CM

## 2022-04-03 ENCOUNTER — Other Ambulatory Visit: Payer: Self-pay | Admitting: Gastroenterology

## 2022-04-03 ENCOUNTER — Other Ambulatory Visit: Payer: Self-pay | Admitting: Family Medicine

## 2022-04-03 DIAGNOSIS — F4001 Agoraphobia with panic disorder: Secondary | ICD-10-CM

## 2022-04-03 MED ORDER — GABAPENTIN 300 MG PO CAPS: ORAL_CAPSULE | ORAL | 2 refills | Status: DC

## 2022-04-06 ENCOUNTER — Other Ambulatory Visit: Payer: Self-pay | Admitting: Family Medicine

## 2022-04-06 DIAGNOSIS — F4001 Agoraphobia with panic disorder: Secondary | ICD-10-CM

## 2022-04-06 MED ORDER — LORAZEPAM 0.5 MG PO TABS: 0.5000 mg | ORAL_TABLET | Freq: Every day | ORAL | 0 refills | Status: DC | PRN

## 2022-04-06 NOTE — Telephone Encounter (Signed)
See Rx request ° °

## 2022-04-10 NOTE — Progress Notes (Unsigned)
I,Gabryel Talamo R Yohanna Tow,acting as a Education administrator for [name], FNP.,have documented all relevant documentation on the behalf of [name], FNP,as directed by  [name], FNP while in the presence of [name], FNP.   Established patient visit   Patient: Shaun Griffith   DOB: April 29, 1960   62 y.o. Male  MRN: [id number] Visit Date: 04/14/2022  Today's healthcare provider: [name], FNP  Introduced to nurse practitioner role and practice setting.  All questions answered.  Discussed provider/patient relationship and expectations.   Chief Complaint  Patient presents with   Follow-up   Subjective    HPI  Hypertension, follow-up  BP Readings from Last 3 Encounters:  04/14/22 126/81  02/17/22 (!) 150/76  01/08/22 (!) 146/87   Wt Readings from Last 3 Encounters:  04/14/22 265 lb (120.2 kg)  02/17/22 267 lb 6.4 oz (121.3 kg)  01/08/22 271 lb (122.9 kg)     He was last seen for hypertension 4 months ago.  BP at that visit was 127/84.   He reports excellent compliance with treatment. He is not having side effects.  He is following a Regular diet. He is not exercising. He does not smoke.  Use of agents associated with hypertension: none.   Outside blood pressures are not being taken.  Pertinent labs Lab Results  Component Value Date   CHOL 175 11/13/2021   HDL 49 11/13/2021   LDLCALC 95 11/13/2021   TRIG 178 (H) 11/13/2021   CHOLHDL 2.6 09/20/2020   Lab Results  Component Value Date   NA 139 11/13/2021   K 3.8 11/13/2021   CREATININE 0.90 11/13/2021   EGFR 97 11/13/2021   GLUCOSE 93 11/13/2021   TSH 1.650 11/13/2021     The 10-year ASCVD risk score (Arnett DK, et al., 2019) is: 10.4%  ---------------------------------------------------------------------------------------------------   Medications: Outpatient Medications Prior to Visit  Medication Sig   b complex vitamins capsule Take 1 capsule by mouth daily.   fluticasone (FLONASE) 50  MCG/ACT nasal spray Place 2 sprays into both nostrils daily.   LORazepam (ATIVAN) 0.5 MG tablet Take 1 tablet (0.5 mg total) by mouth daily as needed for anxiety. Max of 1 tablet per day as needed; due for OV/Virtual Visit   metoprolol succinate (TOPROL-XL) 50 MG 24 hr tablet Take 1 tablet (50 mg total) by mouth daily. Take with or immediately following a meal.   Multiple Vitamin (MULTIVITAMIN) capsule Take 1 capsule by mouth daily.   omeprazole (PRILOSEC) 40 MG capsule TAKE 1 CAPSULE BY MOUTH IN THE MORNING AND AT BEDTIME   rosuvastatin (CRESTOR) 10 MG tablet Take 1 tablet (10 mg total) by mouth daily.   sertraline (ZOLOFT) 50 MG tablet TAKE 1 TABLET BY MOUTH DAILY   traZODone (DESYREL) 50 MG tablet Take 1 tablet (50 mg total) by mouth at bedtime.   [DISCONTINUED] gabapentin (NEURONTIN) 300 MG capsule TAKE ONE (1) CAPSULE BY MOUTH IN THE MORNING, ONE (1) IN THE AFTERNOON, AND TWO (2) AT BEDTIME   [DISCONTINUED] losartan-hydrochlorothiazide (HYZAAR) 50-12.5 MG tablet TAKE 1 TABLET BY MOUTH ONCE DAILY   dicyclomine (BENTYL) 10 MG capsule Take 1 capsule (10 mg total) by mouth 4 (four) times daily -  before meals and at bedtime.   lubiprostone (AMITIZA) 8 MCG capsule Take 1 capsule (8 mcg total) by mouth daily with breakfast. (Patient not taking: Reported on 04/14/2022)   No facility-administered medications prior to visit.    Review of Systems  Objective    BP 126/81 (BP Location: Right Arm, Patient Position: Sitting, Cuff Size: Large)   Pulse 85   Temp 97.6 F (36.4 C) (Oral)   Wt 265 lb (120.2 kg)   SpO2 97%   BMI 33.12 kg/m    Physical Exam Vitals and nursing note reviewed.  Constitutional:      Appearance: Normal appearance. He is obese.  HENT:     Head: Normocephalic and atraumatic.  Eyes:     Pupils: Pupils are equal, round, and reactive to light.  Cardiovascular:     Rate and Rhythm: Normal rate and regular rhythm.     Pulses: Normal pulses.     Heart sounds: Normal  heart sounds.  Pulmonary:     Effort: Pulmonary effort is normal.     Breath sounds: Normal breath sounds.  Musculoskeletal:        General: Normal range of motion.     Cervical back: Normal range of motion.     Right lower leg: No edema.     Left lower leg: No edema.  Skin:    General: Skin is warm and dry.     Capillary Refill: Capillary refill takes less than 2 seconds.  Neurological:     General: No focal deficit present.     Mental Status: He is alert and oriented to person, place, and time. Mental status is at baseline.  Psychiatric:        Mood and Affect: Mood normal.        Behavior: Behavior normal.        Thought Content: Thought content normal.        Judgment: Judgment normal.     No results found for any visits on 04/14/22.  Assessment & Plan     Problem List Items Addressed This Visit       Cardiovascular and Mediastinum   Essential hypertension - Primary    Chronic, stable No complaints at this time OK to refill Hyzaar  F/u at CPE Goal <140/<90      Relevant Medications   losartan-hydrochlorothiazide (HYZAAR) 50-12.5 MG tablet     Nervous and Auditory   Sciatica of right side    Chronic, worsening Wishes to increase gabapentin from 300/300/600 to 800 mg TID Has appt with specialist to assist as well RTC as needed      Relevant Medications   gabapentin (NEURONTIN) 800 MG tablet     Other   Depression, recurrent (HCC)    Chronic, stable Continue zoloft at 50 mg       Return in about 7 months (around 11/13/2022) for annual examination.     Vonna Kotyk, FNP, have reviewed all documentation for this visit. The documentation on 04/14/22 for the exam, diagnosis, procedures, and orders are all accurate and complete.  [name], [place] [phone] (phone) [phone] (fax)  [place]

## 2022-04-14 ENCOUNTER — Encounter: Payer: Self-pay | Admitting: Family Medicine

## 2022-04-14 ENCOUNTER — Ambulatory Visit: Payer: 59 | Admitting: Family Medicine

## 2022-04-14 ENCOUNTER — Ambulatory Visit: Payer: 59 | Admitting: Gastroenterology

## 2022-04-14 VITALS — BP 126/81 | HR 85 | Temp 97.6°F | Wt 265.0 lb

## 2022-04-14 DIAGNOSIS — F339 Major depressive disorder, recurrent, unspecified: Secondary | ICD-10-CM

## 2022-04-14 DIAGNOSIS — I10 Essential (primary) hypertension: Secondary | ICD-10-CM

## 2022-04-14 DIAGNOSIS — M5431 Sciatica, right side: Secondary | ICD-10-CM | POA: Insufficient documentation

## 2022-04-14 MED ORDER — LOSARTAN POTASSIUM-HCTZ 50-12.5 MG PO TABS: 1.0000 | ORAL_TABLET | Freq: Every day | ORAL | 3 refills | Status: DC

## 2022-04-14 MED ORDER — GABAPENTIN 800 MG PO TABS: 800.0000 mg | ORAL_TABLET | Freq: Three times a day (TID) | ORAL | 3 refills | Status: DC

## 2022-04-14 NOTE — Assessment & Plan Note (Signed)
>>  ASSESSMENT AND PLAN FOR DEPRESSION, RECURRENT (HCC) WRITTEN ON 04/14/2022  3:08 PM BY PAYNE, ELISE T, FNP  Chronic, stable Continue zoloft at 50 mg

## 2022-04-14 NOTE — Assessment & Plan Note (Signed)
Chronic, worsening Wishes to increase gabapentin from 300/300/600 to 800 mg TID Has appt with specialist to assist as well RTC as needed

## 2022-04-14 NOTE — Assessment & Plan Note (Signed)
Chronic, stable No complaints at this time OK to refill Hyzaar  F/u at CPE Goal <140/<90

## 2022-04-14 NOTE — Assessment & Plan Note (Addendum)
Chronic, stable Continue zoloft at 50 mg

## 2022-04-15 ENCOUNTER — Other Ambulatory Visit: Payer: Self-pay | Admitting: Family Medicine

## 2022-04-15 DIAGNOSIS — E781 Pure hyperglyceridemia: Secondary | ICD-10-CM

## 2022-05-04 ENCOUNTER — Other Ambulatory Visit: Payer: Self-pay | Admitting: Gastroenterology

## 2022-05-04 MED ORDER — DICYCLOMINE HCL 10 MG PO CAPS: 10.0000 mg | ORAL_CAPSULE | Freq: Three times a day (TID) | ORAL | 0 refills | Status: DC

## 2022-06-05 ENCOUNTER — Other Ambulatory Visit: Payer: Self-pay | Admitting: Family Medicine

## 2022-06-05 DIAGNOSIS — F4001 Agoraphobia with panic disorder: Secondary | ICD-10-CM

## 2022-06-05 NOTE — Telephone Encounter (Signed)
Requested medication (s) are due for refill today - yes  Requested medication (s) are on the active medication list -yes  Future visit scheduled -no  Last refill: 04/06/22 #45  Notes to clinic: non delegated Rx  Requested Prescriptions  Pending Prescriptions Disp Refills   LORazepam (ATIVAN) 0.5 MG tablet [Pharmacy Med Name: LORAZEPAM 0.5 MG TAB] 30 tablet     Sig: TAKE 1 TABLET BY MOUTH DAILY AS NEEDED     Not Delegated - Psychiatry: Anxiolytics/Hypnotics 2 Failed - 06/05/2022  9:17 AM      Failed - This refill cannot be delegated      Failed - Urine Drug Screen completed in last 360 days      Passed - Patient is not pregnant      Passed - Valid encounter within last 6 months    Recent Outpatient Visits           1 month ago Essential hypertension   [place] Tally [name] T, FNP   5 months ago Essential hypertension   [place] [name], FNP   6 months ago Annual physical exam   [facility] Tally [name] T, FNP   8 months ago Depression, recurrent [facility])   [place] Tally [name] T, FNP   1 year ago Chronic LLQ pain   [facility] Tally [name] T, [place]                 Requested Prescriptions  Pending Prescriptions Disp Refills   LORazepam (ATIVAN) 0.5 MG tablet [Pharmacy Med Name: LORAZEPAM 0.5 MG TAB] 30 tablet     Sig: TAKE 1 TABLET BY MOUTH DAILY AS NEEDED     Not Delegated - Psychiatry: Anxiolytics/Hypnotics 2 Failed - 06/05/2022  9:17 AM      Failed - This refill cannot be delegated      Failed - Urine Drug Screen completed in last 360 days      Passed - Patient is not pregnant      Passed - Valid encounter within last 6 months    Recent Outpatient Visits           1 month ago Essential hypertension   [place] [name], FNP   5 months ago Essential hypertension   [place] [name], FNP   6 months ago Annual physical exam   [facility] Tally [name] T, FNP   8 months ago Depression, recurrent [facility])   [place] Tally [name] T, FNP   1 year ago Chronic LLQ pain   [place] [name], [place]

## 2022-06-12 ENCOUNTER — Telehealth: Payer: 59 | Admitting: Family Medicine

## 2022-06-12 DIAGNOSIS — H6691 Otitis media, unspecified, right ear: Secondary | ICD-10-CM

## 2022-06-12 MED ORDER — AMOXICILLIN-POT CLAVULANATE 875-125 MG PO TABS: 1.0000 | ORAL_TABLET | Freq: Two times a day (BID) | ORAL | 0 refills | Status: DC

## 2022-06-12 NOTE — Progress Notes (Signed)
E-Visit for Ear Pain - Acute Otitis Media   We are sorry that you are not feeling well. Here is how we plan to help!  Based on what you have shared with me it looks like you have Acute Otitis Media.  Acute Otitis Media is an infection of the middle or "inner" ear. This type of infection can cause redness, inflammation, and fluid buildup behind the tympanic membrane (ear drum).  The usual symptoms include: Earache/Pain Fever Upper respiratory symptoms Lack of energy/Fatigue/Malaise Slight hearing loss gradually worsening- if the inner ear fills with fluid What causes middle ear infections? Most middle ear infections occur when an infection such as a cold, leads to a build-up of mucus in the middle ear and causes the Eustachian tube (a thin tube that runs from the middle ear to the back of the nose) to become swollen or blocked.   This means mucus can't drain away properly, making it easier for an infection to spread into the middle ear.  How middle ear infections are treated: Most ear infections clear up within three to five days and don't need any specific treatment. If necessary, tylenol or ibuprofen should be used to relieve pain and a high temperature.  If you develop a fever higher than 102, or any significantly worsening symptoms, this could indicate a more serious infection moving to the middle/inner and needs face to face evaluation in an office by a provider.   Antibiotics aren't routinely used to treat middle ear infections, although they may occasionally be prescribed if symptoms persist or are particularly severe. Given your presentation,   I have prescribed Augmentin 875-125 mg one tablet by mouth twice a day for 10 days     Your symptoms should improve over the next 3 days and should resolve in about 7 days. Be sure to complete ALL of the prescription(s) given.  HOME CARE: Wash your hands frequently. If you are prescribed an ear drop, do not place the tip of the bottle on  your ear or touch it with your fingers. You can take Acetaminophen 650 mg every 4-6 hours as needed for pain.  If pain is severe or moderate, you can apply a heating pad (set on low) or hot water bottle (wrapped in a towel) to outer ear for 20 minutes.  This will also increase drainage.  GET HELP RIGHT AWAY IF: Fever is over 102.2 degrees. You develop progressive ear pain or hearing loss. Ear symptoms persist longer than 3 days after treatment.  MAKE SURE YOU: Understand these instructions. Will watch your condition. Will get help right away if you are not doing well or get worse.  Thank you for choosing an e-visit.  Your e-visit answers were reviewed by a board certified advanced clinical practitioner to complete your personal care plan. Depending upon the condition, your plan could have included both over the counter or prescription medications.  Please review your pharmacy choice. Make sure the pharmacy is open so you can pick up the prescription now. If there is a problem, you may contact your provider through MyChart messaging and have the prescription routed to another pharmacy.  Your safety is important to us. If you have drug allergies check your prescription carefully.   For the next 24 hours you can use MyChart to ask questions about today's visit, request a non-urgent call back, or ask for a work or school excuse. You will get an email with a survey after your eVisit asking about your experience. We would   appreciate your feedback. I hope that your e-visit has been valuable and will aid in your recovery.     have provided 5 minutes of non face to face time during this encounter for chart review and documentation.   

## 2022-06-14 ENCOUNTER — Telehealth: Payer: 59 | Admitting: Family Medicine

## 2022-06-14 DIAGNOSIS — H6990 Unspecified Eustachian tube disorder, unspecified ear: Secondary | ICD-10-CM

## 2022-06-14 MED ORDER — PREDNISONE 20 MG PO TABS: 20.0000 mg | ORAL_TABLET | Freq: Two times a day (BID) | ORAL | 0 refills | Status: AC

## 2022-06-14 NOTE — Progress Notes (Signed)
E-Visit for Ear Pain - Eustachian Tube Dysfunction   We are sorry that you are not feeling well. Here is how we plan to help!  Based on what you have shared with me it looks like you have Eustachian Tube Dysfunction.  Eustachian Tube Dysfunction is a condition where the tubes that connect your middle ears to your upper throat become blocked. This can lead to discomfort, hearing difficulties and a feeling of fullness in your ear. Eustachian tube dysfunction usually resolves itself in a few days. The usual symptoms include: Hearing problems Tinnitus, or ringing in your ears Clicking or popping sounds A feeling of fullness in your ears Pain that mimics an ear infection Dizziness, vertigo or balance problems A "tickling" sensation in your ears  ?Eustachian tube dysfunction symptoms may get worse in higher altitudes. This is called barotrauma, and it can happen while scuba diving, flying in an airplane or driving in the mountains.   What causes eustachian tube dysfunction? Allergies and infections (like the common cold and the flu) are the most common causes of eustachian tube dysfunction. These conditions can cause inflammation and mucus buildup, leading to blockage. GERD, or chronic acid reflux, can also cause ETD. This is because stomach acid can back up into your throat and result in inflammation. As mentioned above, altitude changes can also cause ETD.   What are some common eustachian tube dysfunction treatments? In most cases, treatment isn't necessary because ETD often resolves on its own. However, you might need treatment if your symptoms linger for more than two weeks.    Eustachian tube dysfunction treatment depends on the cause and the severity of your condition. Treatments may include home remedies, medications or, in severe cases, surgery.     HOME CARE: Sometimes simple home remedies can help with mild cases of eustachian tube dysfunction. To try and clear the blockage, you  can: Chew gum. Yawn. Swallow. Try the Valsalva maneuver (breathing out forcefully while closing your mouth and pinching your nostrils). Use a saline spray to clear out nasal passages.  MEDICATIONS: Over-the-counter medications can help if allergies are causing eustachian tube dysfunction. Try antihistamines (like cetirizine or diphenhydramine) to ease your symptoms. If you have discomfort, pain relievers -- such as acetaminophen or ibuprofen -- can help.  Sometimes intranasal glucocorticosteroids (like Flonase or Nasacort) help.  Prednisone prescribed.     GET HELP RIGHT AWAY IF: Fever is over 102.2 degrees. You develop progressive ear pain or hearing loss. Ear symptoms persist longer than 3 days after treatment.  MAKE SURE YOU: Understand these instructions. Will watch your condition. Will get help right away if you are not doing well or get worse.  Thank you for choosing an e-visit.  Your e-visit answers were reviewed by a board certified advanced clinical practitioner to complete your personal care plan. Depending upon the condition, your plan could have included both over the counter or prescription medications.  Please review your pharmacy choice. Make sure the pharmacy is open so you can pick up the prescription now. If there is a problem, you may contact your provider through [employer] and have the prescription routed to another pharmacy.  Your safety is important to [place]. If you have drug allergies check your prescription carefully.   For the next 24 hours you can use MyChart to ask questions about today's visit, request a non-urgent call back, or ask for a work or school excuse. You will get an email with a survey after your eVisit asking about your  experience. We would appreciate your feedback. I hope that your e-visit has been valuable and will aid in your recovery.  I have provided 5 minutes of non face to face time during this encounter for chart review and  documentation.

## 2022-06-20 ENCOUNTER — Encounter: Payer: 59 | Admitting: Nurse Practitioner

## 2022-06-20 DIAGNOSIS — H6990 Unspecified Eustachian tube disorder, unspecified ear: Secondary | ICD-10-CM

## 2022-06-20 NOTE — Progress Notes (Signed)
Because you are still having ear pain  and hearing loss despite taking pill antibiotics, I feel your condition warrants further evaluation and I recommend that you be seen in a face to face visit.   NOTE: There will be NO CHARGE for this eVisit   If you are having a true medical emergency please call 911.      For an urgent face to face visit, [facility] has eight urgent care centers for your convenience:   NEW!! [facility] Health Urgent Care Center at [facility] Get Driving Directions [phone] [address], [address] [place], [address]    [facility] Health Urgent Care Center at [facility] Get Driving Directions [phone] [address] [place], [place] [address]   [facility] Health Urgent Care Center [facility]) Get Driving Directions [phone] [address] [place], [place] [address]  [facility] Health Urgent Care Center [facility]) Get Driving Directions [phone] [address] [place],  [place]  [address]  [facility] Health Urgent Care Center [facility] - at [employer]  [phone] [address] [place],  [place] [address]   [facility] Health Urgent Care at [facility] Get Driving Directions [phone] [address] [place], [place] [address]   [facility] Health Urgent Care at [facility] Get Driving Directions  [phone] [address] [place], [place] [address]   [facility] Health Urgent Care at [facility] Directions [phone] [address] [place], [place] [address]  Your MyChart E-visit questionnaire answers were reviewed by a board certified advanced clinical practitioner to complete your personal care plan based on your specific symptoms.  Thank you for using e-Visits.

## 2022-06-20 NOTE — Progress Notes (Signed)
I have spent 5 minutes in review of e-visit questionnaire, review and updating patient chart, medical decision making and response to patient.  ° °Artie Takayama W Larell Baney, NP ° °  °

## 2022-06-21 ENCOUNTER — Ambulatory Visit
Admission: RE | Admit: 2022-06-21 | Discharge: 2022-06-21 | Disposition: A | Discharge status: Home / Self Care | Payer: 59 | Source: Ambulatory Visit | Attending: Urgent Care | Admitting: Urgent Care

## 2022-06-21 VITALS — BP 106/71 | HR 95 | Temp 97.7°F | Resp 18

## 2022-06-21 DIAGNOSIS — H60501 Unspecified acute noninfective otitis externa, right ear: Secondary | ICD-10-CM | POA: Diagnosis not present

## 2022-06-21 MED ORDER — CIPROFLOXACIN-DEXAMETHASONE 0.3-0.1 % OT SUSP: 4.0000 [drp] | Freq: Two times a day (BID) | OTIC | 0 refills | Status: AC

## 2022-06-21 NOTE — ED Provider Notes (Signed)
Shaun Griffith    CSN: [id number] Arrival date & time: 06/21/22  1131      History   Chief Complaint Chief Complaint  Patient presents with  . Otalgia    HPI Shaun Griffith is a 62 y.o. male.    Otalgia   Presents to urgent care with complaint of right ear pain x 2 weeks.  He has been treated by PCP with steroids and antibiotic without relief.  Chart indicates ED visit on 06/12/2022 where Augmentin x 10 days was prescribed.  Another ED visit on 06/14/2022 where prednisone course was prescribed.  He endorses pain in his preauricular area of his face as well as his jaw.  Reports discharge from the ear.  Past Medical History:  Diagnosis Date  . Anxiety   . Depression   . GERD (gastroesophageal reflux disease)   . Hypertension   . IBS (irritable bowel syndrome)   . Sleep apnea     Patient Active Problem List   Diagnosis Date Noted  . Sciatica of right side 04/14/2022  . Adenomatous polyp of colon 02/17/2022  . Abnormal CT scan, esophagus   . Esophageal dysphagia   . Annual physical exam 11/13/2021  . Screening for malignant neoplasm of prostate 11/13/2021  . Screening for diabetes mellitus 11/13/2021  . Depression, recurrent 09/16/2021  . Non-seasonal allergic rhinitis due to pollen 09/16/2021  . Sleep disturbance 09/16/2021  . Essential hypertension 09/16/2021  . Esophagitis 04/08/2021  . LLQ pain 03/04/2021  . Gastric diverticulum 03/04/2021  . Leg pain, anterior, right 06/18/2020  . Tingling 06/18/2020  . Chest pain, atypical 02/25/2015  . Anxiety disorder 02/18/2015  . Borderline diabetes 10/22/2014  . Chest discomfort 10/22/2014  . Groin pain 10/22/2014  . Excessive urination at night 10/22/2014  . Obstructive apnea 10/22/2014  . Snores 10/22/2014  . 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 BACK SURGERY  1990   HNP L5  . COLONOSCOPY    . COLONOSCOPY WITH PROPOFOL N/A 05/13/2017    Procedure: COLONOSCOPY WITH PROPOFOL;  Surgeon: Wyline Mood, MD;  Location: [facility] ENDOSCOPY;  Service: Gastroenterology;  Laterality: N/A;  . COLONOSCOPY WITH PROPOFOL N/A 02/17/2022   Procedure: COLONOSCOPY WITH PROPOFOL;  Surgeon: Wyline Mood, MD;  Location: [facility] ENDOSCOPY;  Service: Gastroenterology;  Laterality: N/A;  . ESOPHAGOGASTRODUODENOSCOPY (EGD) WITH PROPOFOL N/A 12/24/2021   Procedure: ESOPHAGOGASTRODUODENOSCOPY (EGD) WITH PROPOFOL;  Surgeon: Wyline Mood, MD;  Location[facility] ENDOSCOPY;  Service: Gastroenterology;  Laterality: N/A;  . HERNIA REPAIR  1984   right groin  . KNEE SURGERY Left 1992       Home Medications    Prior to Admission medications   Medication Sig Start Date End Date Taking? Authorizing Provider  amoxicillin-clavulanate (AUGMENTIN) 875-125 MG tablet Take 1 tablet by mouth 2 (two) times daily. 06/12/22   Delorse Lek, FNP  b complex vitamins capsule Take 1 capsule by mouth daily.    [provider]  dicyclomine (BENTYL) 10 MG capsule Take 1 capsule (10 mg total) by mouth 4 (four) times daily -  before meals and at bedtime. 05/04/22 08/04/22  Jacky Kindle, FNP  fluticasone (FLONASE) 50 MCG/ACT nasal spray Place 2 sprays into both nostrils daily. 09/16/21   Jacky Kindle, FNP  gabapentin (NEURONTIN) 800 MG tablet Take 1 tablet (800 mg total) by mouth 3 (three) times daily. 04/14/22   Jacky Kindle, FNP  LORazepam (ATIVAN) 0.5 MG tablet  TAKE 1 TABLET BY MOUTH DAILY AS NEEDED 06/08/22   Jacky Kindle, FNP  losartan-hydrochlorothiazide (HYZAAR) 50-12.5 MG tablet Take 1 tablet by mouth daily. 04/14/22   Jacky Kindle, FNP  lubiprostone (AMITIZA) 8 MCG capsule Take 1 capsule (8 mcg total) by mouth daily with breakfast. Patient not taking: Reported on 04/14/2022 11/13/21   Jacky Kindle, FNP  metoprolol succinate (TOPROL-XL) 50 MG 24 hr tablet Take 1 tablet (50 mg total) by mouth daily. Take with or immediately following a meal. 09/16/21 09/11/22  Merita Norton T, FNP   Multiple Vitamin (MULTIVITAMIN) capsule Take 1 capsule by mouth daily.    [provider]  omeprazole (PRILOSEC) 40 MG capsule TAKE 1 CAPSULE BY MOUTH IN THE MORNING AND AT BEDTIME 04/03/22   Wyline Mood, MD  rosuvastatin (CRESTOR) 10 MG tablet TAKE 1 TABLET BY MOUTH ONCE DAILY 04/15/22   Merita Norton T, FNP  sertraline (ZOLOFT) 50 MG tablet TAKE 1 TABLET BY MOUTH DAILY 03/05/22   Jacky Kindle, FNP  traZODone (DESYREL) 50 MG tablet Take 1 tablet (50 mg total) by mouth at bedtime. 09/16/21   Jacky Kindle, FNP    Family History Family History  Adopted: Yes  Problem Relation Age of Onset  . Diabetes Mother   . Depression Mother   . Cancer Father        skin and blood    Social History Social History   Tobacco Use  . Smoking status: Former    Types: Cigarettes  . Smokeless tobacco: Never  . Tobacco comments:    Social smoker with concurrent use of alcohol   Vaping Use  . Vaping Use: Never used  Substance Use Topics  . Alcohol use: Yes    Alcohol/week: 12.0 standard drinks of alcohol    Types: 12 Cans of beer per week    Comment: Moderate alcohol use, drinks beer 3-4 days a week; drinks 8-10 beers.NO ETOH IN 1 WEEK  . Drug use: No     Allergies   Sulfadimethoxine   Review of Systems Review of Systems  HENT:  Positive for ear pain.      Physical Exam Triage Vital Signs ED Triage Vitals [06/21/22 1139]  Enc Vitals Group     BP      Pulse      Resp      Temp      Temp src      SpO2      Weight      Height      Head Circumference      Peak Flow      Pain Score 6     Pain Loc      Pain Edu?      Excl. in GC?    No data found.  Updated Vital Signs There were no vitals taken for this visit.  Visual Acuity Right Eye Distance:   Left Eye Distance:   Bilateral Distance:    Right Eye Near:   Left Eye Near:    Bilateral Near:     Physical Exam Vitals reviewed.  Constitutional:      Appearance: Normal appearance.  HENT:     Head:       Ears:     Comments: Right EAC is very erythematous with discharge in the canal. Skin:    General: Skin is warm and dry.  Neurological:     General: No focal deficit present.     Mental Status: He is  alert and oriented to person, place, and time.  Psychiatric:        Mood and Affect: Mood normal.        Behavior: Behavior normal.     UC Treatments / Results  Labs (all labs ordered are listed, but only abnormal results are displayed) Labs Reviewed - No data to display  EKG   Radiology No results found.  Procedures Procedures (including critical care time)  Medications Ordered in UC Medications - No data to display  Initial Impression / Assessment and Plan / UC Course  I have reviewed the triage vital signs and the nursing notes.  Pertinent labs & imaging results that were available during my care of the patient were reviewed by me and considered in my medical decision making (see chart for details).   Shaun Griffith is a 62 y.o. male presenting with right ear and jaw pain. Patient is afebrile without recent antipyretics, satting well on room air. Overall is well appearing though non-toxic, well hydrated, without respiratory distress.  Right EAC is erythematous with debris/discharge in the canal.  Treating for right otitis externa with antibacterial therapy.  Counseled patient on potential for adverse effects with medications prescribed/recommended today, ER and return-to-clinic precautions discussed, patient verbalized understanding and agreement with care plan.    Final Clinical Impressions(s) / UC Diagnoses   Final diagnoses:  None   Discharge Instructions   None    ED Prescriptions   None    PDMP not reviewed this encounter.   Charma Igo, [place] 06/21/22 1149

## 2022-06-21 NOTE — Discharge Instructions (Signed)
Follow up here or with your primary care provider if your symptoms are worsening or not improving with treatment.     

## 2022-06-21 NOTE — ED Triage Notes (Signed)
Patient presents to UC for right ear pain x 2 weeks. Treated by PCP with steroids and antibiotics with no relief.

## 2022-10-02 ENCOUNTER — Ambulatory Visit: Payer: 59 | Admitting: Family Medicine

## 2022-10-02 ENCOUNTER — Encounter: Payer: Self-pay | Admitting: Family Medicine

## 2022-10-02 VITALS — BP 137/86 | HR 87 | Ht 74.0 in | Wt 288.3 lb

## 2022-10-02 DIAGNOSIS — W57XXXS Bitten or stung by nonvenomous insect and other nonvenomous arthropods, sequela: Secondary | ICD-10-CM

## 2022-10-02 DIAGNOSIS — R7303 Prediabetes: Secondary | ICD-10-CM | POA: Diagnosis not present

## 2022-10-02 DIAGNOSIS — F339 Major depressive disorder, recurrent, unspecified: Secondary | ICD-10-CM

## 2022-10-02 DIAGNOSIS — R5382 Chronic fatigue, unspecified: Secondary | ICD-10-CM

## 2022-10-02 DIAGNOSIS — R200 Anesthesia of skin: Secondary | ICD-10-CM

## 2022-10-02 DIAGNOSIS — N401 Enlarged prostate with lower urinary tract symptoms: Secondary | ICD-10-CM

## 2022-10-02 DIAGNOSIS — L732 Hidradenitis suppurativa: Secondary | ICD-10-CM | POA: Diagnosis not present

## 2022-10-02 DIAGNOSIS — R202 Paresthesia of skin: Secondary | ICD-10-CM

## 2022-10-02 DIAGNOSIS — E7800 Pure hypercholesterolemia, unspecified: Secondary | ICD-10-CM

## 2022-10-02 DIAGNOSIS — I10 Essential (primary) hypertension: Secondary | ICD-10-CM

## 2022-10-02 DIAGNOSIS — W57XXXA Bitten or stung by nonvenomous insect and other nonvenomous arthropods, initial encounter: Secondary | ICD-10-CM | POA: Insufficient documentation

## 2022-10-02 MED ORDER — METOPROLOL SUCCINATE ER 50 MG PO TB24: 50.0000 mg | ORAL_TABLET | Freq: Every day | ORAL | 3 refills | Status: DC

## 2022-10-02 NOTE — Assessment & Plan Note (Signed)
Chronic, worsening Pt has weaned himself off zoloft 50 mg Denies SI or HI Continue to monitor

## 2022-10-02 NOTE — Assessment & Plan Note (Signed)
>>  ASSESSMENT AND PLAN FOR DEPRESSION, RECURRENT (HCC) WRITTEN ON 10/02/2022  8:14 PM BY PAYNE, ELISE T, FNP  Chronic, worsening Pt has weaned himself off zoloft 50 mg Denies SI or HI Continue to monitor

## 2022-10-02 NOTE — Assessment & Plan Note (Signed)
Chronic, worsening L arm to fingers Has seen neuro and plans to see ortho

## 2022-10-02 NOTE — Assessment & Plan Note (Signed)
Chronic, worsening Of note, recently changed his regimen from neuro including Lyrica 50 TID, gabapentin 800 TID Tramadol 50 BID

## 2022-10-02 NOTE — Assessment & Plan Note (Signed)
Chronic, stable Body mass index is 37.02 kg/m. In setting of HTN, HLD, pre-diabetes Discussed importance of healthy weight management Discussed diet and exercise

## 2022-10-02 NOTE — Assessment & Plan Note (Signed)
Chronic, stable Repeat A1c Continue to recommend balanced, lower carb meals. Smaller meal size, adding snacks. Choosing water as drink of choice and increasing purposeful exercise.  

## 2022-10-02 NOTE — Assessment & Plan Note (Signed)
Chronic; self limiting One minor abscess in L axilla Continue to recommend conservative treatment at this time

## 2022-10-02 NOTE — Assessment & Plan Note (Signed)
Chronic, The 10-year ASCVD risk score (Arnett DK, et al., 2019) is: 17.8% Repeat LP recommend diet low in saturated fat and regular exercise - 30 min at least 5 times per week Continues on crestor 10 mg

## 2022-10-02 NOTE — Assessment & Plan Note (Signed)
Chronic fatigue; pt reports multiple tick bites of multiple dates Request for lyme panel

## 2022-10-02 NOTE — Assessment & Plan Note (Signed)
Chronic, stable Continue toprol Refills provided

## 2022-10-02 NOTE — Progress Notes (Signed)
Established patient visit   Patient: Shaun Griffith   DOB: May 13, 1960   62 y.o. Male  MRN: [id number] Visit Date: 10/02/2022  Today's healthcare provider: Jacky Kindle, FNP  Re Introduced to nurse practitioner role and practice setting.  All questions answered.  Discussed provider/patient relationship and expectations.  Subjective    HPI HPI   Patient is present due to having trouble with his sleep. He reports he is sleeping too much during the day on and off. Patient reports this started after ear infection that turned into fungus and he was having to take a lot of steroids and that when it started to become worse. Patient also reports pain under his left arm arm pitt for a few weeks and neuro advised of possible sweat gland infection. He also has numbness in left hand for 2 weeks and was advised by neuro to speak with his pcp Last edited by Acey Lav, CMA on 10/02/2022  1:11 PM.      Medications: Outpatient Medications Prior to Visit  Medication Sig   b complex vitamins capsule Take 1 capsule by mouth daily.   fluticasone (FLONASE) 50 MCG/ACT nasal spray Place 2 sprays into both nostrils daily.   gabapentin (NEURONTIN) 800 MG tablet Take 1 tablet (800 mg total) by mouth 3 (three) times daily.   LORazepam (ATIVAN) 0.5 MG tablet TAKE 1 TABLET BY MOUTH DAILY AS NEEDED   losartan-hydrochlorothiazide (HYZAAR) 50-12.5 MG tablet Take 1 tablet by mouth daily.   Multiple Vitamin (MULTIVITAMIN) capsule Take 1 capsule by mouth daily.   omeprazole (PRILOSEC) 40 MG capsule TAKE 1 CAPSULE BY MOUTH IN THE MORNING AND AT BEDTIME   rosuvastatin (CRESTOR) 10 MG tablet TAKE 1 TABLET BY MOUTH ONCE DAILY   traZODone (DESYREL) 50 MG tablet Take 1 tablet (50 mg total) by mouth at bedtime.   [DISCONTINUED] amoxicillin-clavulanate (AUGMENTIN) 875-125 MG tablet Take 1 tablet by mouth 2 (two) times daily. (Patient not taking: Reported on 10/02/2022)   [DISCONTINUED] dicyclomine (BENTYL) 10 MG  capsule Take 1 capsule (10 mg total) by mouth 4 (four) times daily -  before meals and at bedtime.   [DISCONTINUED] lubiprostone (AMITIZA) 8 MCG capsule Take 1 capsule (8 mcg total) by mouth daily with breakfast. (Patient not taking: Reported on 04/14/2022)   [DISCONTINUED] metoprolol succinate (TOPROL-XL) 50 MG 24 hr tablet Take 1 tablet (50 mg total) by mouth daily. Take with or immediately following a meal.   [DISCONTINUED] sertraline (ZOLOFT) 50 MG tablet TAKE 1 TABLET BY MOUTH DAILY (Patient not taking: Reported on 10/02/2022)   No facility-administered medications prior to visit.       Objective    BP 137/86 (BP Location: Right Arm, Patient Position: Sitting, Cuff Size: Large)   Pulse 87   Ht 6\' 2"  (1.88 m)   Wt 288 lb 4.8 oz (130.8 kg)   SpO2 98%   BMI 37.02 kg/m   Physical Exam Vitals and nursing note reviewed.  Constitutional:      Appearance: Normal appearance. He is obese.  HENT:     Head: Normocephalic and atraumatic.  Eyes:     Pupils: Pupils are equal, round, and reactive to light.  Cardiovascular:     Rate and Rhythm: Normal rate and regular rhythm.     Pulses: Normal pulses.     Heart sounds: Normal heart sounds.  Pulmonary:     Effort: Pulmonary effort is normal.     Breath sounds: Normal breath sounds.  Musculoskeletal:  General: Normal range of motion.     Cervical back: Normal range of motion.  Skin:    General: Skin is warm and dry.     Capillary Refill: Capillary refill takes less than 2 seconds.     Findings: Lesion present.  Neurological:     General: No focal deficit present.     Mental Status: He is alert and oriented to person, place, and time. Mental status is at baseline.     No results found for any visits on 10/02/22.  Assessment & Plan     Problem List Items Addressed This Visit       Cardiovascular and Mediastinum   Primary hypertension    Chronic, stable Continue toprol Refills provided       Relevant Medications    metoprolol succinate (TOPROL-XL) 50 MG 24 hr tablet   Other Relevant Orders   CBC with Differential/Platelet   Comprehensive Metabolic Panel (CMET)   TSH+T4F+T3Free     Musculoskeletal and Integument   Hidradenitis suppurativa    Chronic; self limiting One minor abscess in L axilla Continue to recommend conservative treatment at this time      Tick bite    Chronic fatigue; pt reports multiple tick bites of multiple dates Request for lyme panel         Genitourinary   Benign localized prostatic hyperplasia with lower urinary tract symptoms (LUTS)    Known, stable/chronic LUTS; recommend PSA in place of DRE. If PSA is elevated for age, we will repeat; if PSA remains elevated pt will be referred to urology for DRE and next steps for best treatment.       Relevant Orders   PSA     Other   Borderline diabetes    Chronic, stable Repeat A1c Continue to recommend balanced, lower carb meals. Smaller meal size, adding snacks. Choosing water as drink of choice and increasing purposeful exercise.       Chronic fatigue - Primary    Chronic, worsening Of note, recently changed his regimen from neuro including Lyrica 50 TID, gabapentin 800 TID Tramadol 50 BID      Relevant Orders   B12 and Folate Panel   Vitamin D (25 hydroxy)   Vitamin B1   Vitamin B6   Lyme Disease Abs IgG, IgM, IFA, CSF   Depression, recurrent (HCC)    Chronic, worsening Pt has weaned himself off zoloft 50 mg Denies SI or HI Continue to monitor      Elevated LDL cholesterol level    Chronic, The 10-year ASCVD risk score (Arnett DK, et al., 2019) is: 17.8% Repeat LP recommend diet low in saturated fat and regular exercise - 30 min at least 5 times per week Continues on crestor 10 mg      Relevant Orders   Lipid panel   Morbid obesity (HCC)    Chronic, stable Body mass index is 37.02 kg/m. In setting of HTN, HLD, pre-diabetes Discussed importance of healthy weight management Discussed diet and  exercise       Numbness and tingling    Chronic, worsening L arm to fingers Has seen neuro and plans to see ortho      Relevant Orders   B12 and Folate Panel   Vitamin B1   Vitamin B6   Return if symptoms worsen or fail to improve.     Leilani Merl, FNP, have reviewed all documentation for this visit. The documentation on 10/02/22 for the exam, diagnosis, procedures, and orders  are all accurate and complete.  Jacky Kindle, FNP  [facility] [phone] (phone) [phone] (fax)  [facility]

## 2022-10-02 NOTE — Assessment & Plan Note (Signed)
Known, stable/chronic LUTS; recommend PSA in place of DRE. If PSA is elevated for age, we will repeat; if PSA remains elevated pt will be referred to urology for DRE and next steps for best treatment.

## 2022-10-06 ENCOUNTER — Encounter: Payer: Self-pay | Admitting: Family Medicine

## 2022-10-06 ENCOUNTER — Other Ambulatory Visit: Payer: Self-pay | Admitting: Family Medicine

## 2022-10-06 DIAGNOSIS — D509 Iron deficiency anemia, unspecified: Secondary | ICD-10-CM

## 2022-10-06 NOTE — Progress Notes (Signed)
B1 and B6 pending; borderline low Hgb and Hct as well as low RBC. All other labs are stable; cholesterol is much improved. Negative Lyme history.  Recommend multivitamin with Vit D3 at 2000 IU daily.  Can place consult to hematology or recommend additional follow up with GI given concern for low iron levels.

## 2022-10-08 ENCOUNTER — Telehealth: Payer: Self-pay | Admitting: *Deleted

## 2022-10-08 ENCOUNTER — Inpatient Hospital Stay: Payer: 59

## 2022-10-08 ENCOUNTER — Inpatient Hospital Stay: Payer: 59 | Attending: Oncology | Admitting: Oncology

## 2022-10-08 ENCOUNTER — Encounter: Payer: Self-pay | Admitting: Oncology

## 2022-10-08 VITALS — BP 121/76 | HR 82 | Temp 99.3°F | Resp 18 | Ht 74.0 in | Wt 283.0 lb

## 2022-10-08 DIAGNOSIS — Z87891 Personal history of nicotine dependence: Secondary | ICD-10-CM | POA: Insufficient documentation

## 2022-10-08 DIAGNOSIS — Z79899 Other long term (current) drug therapy: Secondary | ICD-10-CM | POA: Insufficient documentation

## 2022-10-08 DIAGNOSIS — I10 Essential (primary) hypertension: Secondary | ICD-10-CM | POA: Diagnosis not present

## 2022-10-08 DIAGNOSIS — E611 Iron deficiency: Secondary | ICD-10-CM | POA: Insufficient documentation

## 2022-10-08 DIAGNOSIS — K589 Irritable bowel syndrome without diarrhea: Secondary | ICD-10-CM | POA: Insufficient documentation

## 2022-10-08 DIAGNOSIS — G4730 Sleep apnea, unspecified: Secondary | ICD-10-CM | POA: Diagnosis not present

## 2022-10-08 DIAGNOSIS — K219 Gastro-esophageal reflux disease without esophagitis: Secondary | ICD-10-CM | POA: Diagnosis not present

## 2022-10-08 DIAGNOSIS — F419 Anxiety disorder, unspecified: Secondary | ICD-10-CM | POA: Diagnosis not present

## 2022-10-08 DIAGNOSIS — F32A Depression, unspecified: Secondary | ICD-10-CM | POA: Diagnosis not present

## 2022-10-08 DIAGNOSIS — D696 Thrombocytopenia, unspecified: Secondary | ICD-10-CM

## 2022-10-08 LAB — CBC (CANCER CENTER ONLY)
HCT: 42.2 % (ref 39.0–52.0)
Hemoglobin: 13.9 g/dL (ref 13.0–17.0)
MCH: 32.9 pg (ref 26.0–34.0)
MCHC: 32.9 g/dL (ref 30.0–36.0)
MCV: 99.8 fL (ref 80.0–100.0)
Platelet Count: 171 10*3/uL (ref 150–400)
RBC: 4.23 MIL/uL (ref 4.22–5.81)
RDW: 12.4 % (ref 11.5–15.5)
WBC Count: 7 10*3/uL (ref 4.0–10.5)
nRBC: 0 % (ref 0.0–0.2)

## 2022-10-08 LAB — IRON AND TIBC
Iron: 110 ug/dL (ref 45–182)
Saturation Ratios: 32 % (ref 17.9–39.5)
TIBC: 349 ug/dL (ref 250–450)
UIBC: 239 ug/dL

## 2022-10-08 LAB — FERRITIN: Ferritin: 312 ng/mL (ref 24–336)

## 2022-10-08 NOTE — Telephone Encounter (Signed)
Pt given lab results per notes of E. Suzie Portela, FNP from 10/06/22 and 10/07/22 on 10/08/22. Pt verbalized understanding. Patient requesting lab results from today . Reported to patient labs still in process and after provider has reviewed results, he will be notified.

## 2022-10-08 NOTE — Progress Notes (Signed)
[name]  Telephone:([phone]) [phone] [phone]  ID: Shaun Griffith OB: December 21, 1960  MR#: [id number]  AOZ#:[id number]  Patient Care Team: Jacky Kindle, FNP as PCP - General (Family Medicine)  CHIEF COMPLAINT: Iron deficiency and thrombocytopenia.  INTERVAL HISTORY: Patient is a 62 year old male who was noted to have mild iron deficiency and thrombocytopenia on routine blood work.  He currently feels well and is asymptomatic.  He does not complain of any weakness or fatigue.  He has no neurologic complaints.  He denies any recent fevers or illnesses.  He has a good appetite and denies weight loss.  He has no chest pain, shortness of breath, cough, or hemoptysis.  He denies any nausea, vomiting, constipation, or diarrhea.  He has no urinary complaints.  Patient feels at his baseline and offers no specific complaints today.  REVIEW OF SYSTEMS:   Review of Systems  Constitutional: Negative.  Negative for fever, malaise/fatigue and weight loss.  Respiratory: Negative.  Negative for cough, hemoptysis and shortness of breath.   Cardiovascular: Negative.  Negative for chest pain and leg swelling.  Gastrointestinal: Negative.  Negative for abdominal pain, blood in stool and melena.  Genitourinary: Negative.  Negative for dysuria.  Musculoskeletal: Negative.  Negative for back pain.  Skin: Negative.  Negative for rash.  Neurological: Negative.  Negative for dizziness, focal weakness, weakness and headaches.  Psychiatric/Behavioral: Negative.  The patient is not nervous/anxious.     As per HPI. Otherwise, a complete review of systems is negative.  PAST MEDICAL HISTORY: Past Medical History:  Diagnosis Date   Allergy    Anemia    Anxiety    Arthritis    Depression    GERD (gastroesophageal reflux disease)    Hypertension    IBS (irritable bowel syndrome)    Sleep apnea     PAST SURGICAL HISTORY: Past Surgical History:  Procedure Laterality Date   BACK  SURGERY  1990   HNP L5   COLONOSCOPY     COLONOSCOPY WITH PROPOFOL N/A 05/13/2017   Procedure: COLONOSCOPY WITH PROPOFOL;  Surgeon: Wyline Mood, MD;  Location: [facility] ENDOSCOPY;  Service: Gastroenterology;  Laterality: N/A;   COLONOSCOPY WITH PROPOFOL N/A 02/17/2022   Procedure: COLONOSCOPY WITH PROPOFOL;  Surgeon: Wyline Mood, MD;  Location: [facility] ENDOSCOPY;  Service: Gastroenterology;  Laterality: N/A;   ESOPHAGOGASTRODUODENOSCOPY (EGD) WITH PROPOFOL N/A 12/24/2021   Procedure: ESOPHAGOGASTRODUODENOSCOPY (EGD) WITH PROPOFOL;  Surgeon: Wyline Mood, MD;  Location: [facility] ENDOSCOPY;  Service: Gastroenterology;  Laterality: N/A;   HERNIA REPAIR  1984   right groin   KNEE SURGERY Left 1992    FAMILY HISTORY: Family History  Adopted: Yes  Problem Relation Age of Onset   Diabetes Mother    Depression Mother    Cancer Father        skin and blood    ADVANCED DIRECTIVES (Y/N):  N  HEALTH MAINTENANCE: Social History   Tobacco Use   Smoking status: Former    Types: Cigarettes   Smokeless tobacco: Never   Tobacco comments:    Social smoker with concurrent use of alcohol   Vaping Use   Vaping status: Never Used  Substance Use Topics   Alcohol use: Yes    Alcohol/week: 18.0 standard drinks of alcohol    Types: 18 Cans of beer per week    Comment: Moderate alcohol use, drinks beer 3-4 days a week; drinks 8-10 beers.NO ETOH IN 1 WEEK   Drug use: No     Colonoscopy:  PAP:  Bone density:  Lipid panel:  Allergies  Allergen Reactions   Sulfadimethoxine Nausea Only    flu like symptoms    Current Outpatient Medications  Medication Sig Dispense Refill   b complex vitamins capsule Take 1 capsule by mouth daily.     fluticasone (FLONASE) 50 MCG/ACT nasal spray Place 2 sprays into both nostrils daily. 16 g 6   gabapentin (NEURONTIN) 800 MG tablet Take 1 tablet (800 mg total) by mouth 3 (three) times daily. 270 tablet 3   LORazepam (ATIVAN) 0.5 MG tablet TAKE 1 TABLET BY MOUTH DAILY AS  NEEDED 30 tablet 3   losartan-hydrochlorothiazide (HYZAAR) 50-12.5 MG tablet Take 1 tablet by mouth daily. 90 tablet 3   metoprolol succinate (TOPROL-XL) 50 MG 24 hr tablet Take 1 tablet (50 mg total) by mouth daily. Take with or immediately following a meal. 90 tablet 3   Multiple Vitamin (MULTIVITAMIN) capsule Take 1 capsule by mouth daily.     omeprazole (PRILOSEC) 40 MG capsule TAKE 1 CAPSULE BY MOUTH IN THE MORNING AND AT BEDTIME 90 capsule 3   pregabalin (LYRICA) 50 MG capsule Take 50 mg by mouth 3 (three) times daily.     rosuvastatin (CRESTOR) 10 MG tablet TAKE 1 TABLET BY MOUTH ONCE DAILY 90 tablet 1   traMADol (ULTRAM) 50 MG tablet Take 1 tablet by mouth 2 (two) times daily.     traZODone (DESYREL) 50 MG tablet Take 1 tablet (50 mg total) by mouth at bedtime. 90 tablet 3   No current facility-administered medications for this visit.    OBJECTIVE: Vitals:   10/08/22 1330  BP: 121/76  Pulse: 82  Resp: 18  Temp: 99.3 F (37.4 C)  SpO2: 95%     Body mass index is 36.34 kg/m.    ECOG FS:0 - Asymptomatic  General: Well-developed, well-nourished, no acute distress. Eyes: Pink conjunctiva, anicteric sclera. HEENT: Normocephalic, moist mucous membranes. Lungs: No audible wheezing or coughing. Heart: Regular rate and rhythm. Abdomen: Soft, nontender, no obvious distention. Musculoskeletal: No edema, cyanosis, or clubbing. Neuro: Alert, answering all questions appropriately. Cranial nerves grossly intact. Skin: No rashes or petechiae noted. Psych: Normal affect. Lymphatics: No cervical, calvicular, axillary or inguinal LAD.   LAB RESULTS:  Lab Results  Component Value Date   NA 142 10/02/2022   K 3.8 10/02/2022   CL 106 10/02/2022   CO2 25 10/02/2022   GLUCOSE 113 (H) 10/02/2022   BUN 19 10/02/2022   CREATININE 0.79 10/02/2022   CALCIUM 9.4 10/02/2022   PROT 6.7 10/02/2022   ALBUMIN 4.2 10/02/2022   AST 20 10/02/2022   ALT 15 10/02/2022   ALKPHOS 51 10/02/2022    BILITOT 0.4 10/02/2022   GFRNONAA 102 04/20/2019   GFRAA 117 04/20/2019    Lab Results  Component Value Date   WBC 7.0 10/08/2022   NEUTROABS 3.2 10/02/2022   HGB 13.9 10/08/2022   HCT 42.2 10/08/2022   MCV 99.8 10/08/2022   PLT 171 10/08/2022   Lab Results  Component Value Date   IRON 110 10/08/2022   TIBC 349 10/08/2022   IRONPCTSAT 32 10/08/2022    Lab Results  Component Value Date   FERRITIN 312 10/08/2022     STUDIES: No results found.  ASSESSMENT: Iron deficiency and thrombocytopenia  PLAN:    Iron deficiency: Resolved.  Patient's hemoglobin and iron stores are within normal limits.  Previously B12 and folate were also within normal limits.  Recent colonoscopy removed several polyps, but no other significant pathology.  No intervention is needed at this time.  Patient does not require additional follow-up.please refer patient back if there are any questions or concerns. Thrombocytopenia: Resolved.  Laboratory work as above.  Patient expressed understanding and was in agreement with this plan. He also understands that He can call clinic at any time with any questions, concerns, or complaints.    Jeralyn Ruths, MD   10/08/2022 4:57 PM

## 2022-10-09 ENCOUNTER — Other Ambulatory Visit: Payer: Self-pay | Admitting: Family Medicine

## 2022-10-09 DIAGNOSIS — D509 Iron deficiency anemia, unspecified: Secondary | ICD-10-CM

## 2022-10-29 ENCOUNTER — Telehealth: Payer: 59 | Admitting: Oncology

## 2022-11-19 ENCOUNTER — Other Ambulatory Visit: Payer: Self-pay | Admitting: Family Medicine

## 2022-11-19 DIAGNOSIS — E781 Pure hyperglyceridemia: Secondary | ICD-10-CM

## 2022-11-20 NOTE — Telephone Encounter (Signed)
Requested Prescriptions  Pending Prescriptions Disp Refills   rosuvastatin (CRESTOR) 10 MG tablet [Pharmacy Med Name: ROSUVASTATIN CALCIUM 10 MG TAB] 90 tablet 0    Sig: TAKE 1 TABLET BY MOUTH ONCE DAILY     Cardiovascular:  Antilipid - Statins 2 Failed - 11/19/2022  2:27 PM      Failed - Lipid Panel in normal range within the last 12 months    Cholesterol, Total  Date Value Ref Range Status  10/02/2022 127 100 - 199 mg/dL Final   LDL Cholesterol (Calc)  Date Value Ref Range Status  12/31/2016 83 mg/dL (calc) Final    Comment:    Reference range: <100 . Desirable range <100 mg/dL for primary prevention;   <70 mg/dL for patients with CHD or diabetic patients  with > or = 2 CHD risk factors. Marland Kitchen LDL-C is now calculated using the Martin-Hopkins  calculation, which is a validated novel method providing  better accuracy than the Friedewald equation in the  estimation of LDL-C.  Horald Pollen et al. Lenox Ahr. 4098;119(14): 2061-2068  (http://education.[employer].com/faq/FAQ164)    LDL Chol Calc (NIH)  Date Value Ref Range Status  10/02/2022 51 0 - 99 mg/dL Final   HDL  Date Value Ref Range Status  10/02/2022 55 >39 mg/dL Final   Triglycerides  Date Value Ref Range Status  10/02/2022 116 0 - 149 mg/dL Final         Passed - Cr in normal range and within 360 days    Creat  Date Value Ref Range Status  12/31/2016 0.73 0.70 - 1.33 mg/dL Final    Comment:    For patients >59 years of age, the reference limit for Creatinine is approximately 13% higher for people identified as African-American. .    Creatinine, Ser  Date Value Ref Range Status  10/02/2022 0.79 0.76 - 1.27 mg/dL Final         Passed - Patient is not pregnant      Passed - Valid encounter within last 12 months    Recent Outpatient Visits           1 month ago Chronic fatigue   [place] [facility] Jacky Kindle, FNP   7 months ago Essential hypertension   [place] [facility] Jacky Kindle, FNP   11 months ago Essential hypertension   [place] [facility] Jacky Kindle, FNP   1 year ago Annual physical exam   [facility] Jacky Kindle, FNP   1 year ago Depression, recurrent [facility])   [facility] Jacky Kindle, [place]

## 2022-12-29 ENCOUNTER — Ambulatory Visit: Payer: 59 | Admitting: Family Medicine

## 2022-12-29 ENCOUNTER — Encounter: Payer: Self-pay | Admitting: Family Medicine

## 2022-12-29 VITALS — BP 100/68 | HR 93 | Temp 98.2°F | Resp 18 | Ht 74.0 in | Wt 277.0 lb

## 2022-12-29 DIAGNOSIS — R200 Anesthesia of skin: Secondary | ICD-10-CM

## 2022-12-29 DIAGNOSIS — I10 Essential (primary) hypertension: Secondary | ICD-10-CM | POA: Diagnosis not present

## 2022-12-29 DIAGNOSIS — R7303 Prediabetes: Secondary | ICD-10-CM

## 2022-12-29 DIAGNOSIS — R42 Dizziness and giddiness: Secondary | ICD-10-CM | POA: Diagnosis not present

## 2022-12-29 DIAGNOSIS — F339 Major depressive disorder, recurrent, unspecified: Secondary | ICD-10-CM

## 2022-12-29 DIAGNOSIS — J0141 Acute recurrent pansinusitis: Secondary | ICD-10-CM | POA: Diagnosis not present

## 2022-12-29 DIAGNOSIS — N401 Enlarged prostate with lower urinary tract symptoms: Secondary | ICD-10-CM

## 2022-12-29 DIAGNOSIS — E7800 Pure hypercholesterolemia, unspecified: Secondary | ICD-10-CM

## 2022-12-29 DIAGNOSIS — R202 Paresthesia of skin: Secondary | ICD-10-CM

## 2022-12-29 MED ORDER — LOSARTAN POTASSIUM 25 MG PO TABS: 25.0000 mg | ORAL_TABLET | Freq: Every day | ORAL | 0 refills | Status: DC

## 2022-12-29 MED ORDER — AZELASTINE-FLUTICASONE 137-50 MCG/ACT NA SUSP: 1.0000 | Freq: Two times a day (BID) | NASAL | 11 refills | Status: AC

## 2022-12-29 MED ORDER — LEVOCETIRIZINE DIHYDROCHLORIDE 5 MG PO TABS: 5.0000 mg | ORAL_TABLET | Freq: Every evening | ORAL | 11 refills | Status: DC

## 2022-12-29 MED ORDER — AZITHROMYCIN 500 MG PO TABS: 500.0000 mg | ORAL_TABLET | Freq: Every day | ORAL | 0 refills | Status: DC

## 2022-12-29 NOTE — Progress Notes (Signed)
Established patient visit   Patient: Shaun Griffith   DOB: [birth year]   62 y.o. Male  MRN: [id number] Visit Date: 12/29/2022  Today's healthcare provider: Jacky Kindle, FNP  Introduced to nurse practitioner role and practice setting.  All questions answered.  Discussed provider/patient relationship and expectations.  Subjective    Sinusitis  Dizziness   HPI     Sinusitis    Additional comments: Patient has had sinus pain, pressure and congestion for about 2 weeks.          Hypotension    Additional comments: Patient has noticed low blood pressure readings lately ranging around 105/70s        Comments   It is of note that the patient has been using Astelin and Flonasae nasal spray.  He had a recent bout of IBSC and used a laxative and he said it was pretty bad.      Last edited by Adline Peals, CMA on 12/29/2022  2:19 PM.      The patient, with a history of IBS with constipation and neurologic issues, presents with sinus congestion, low blood pressure, and a recent severe IBS-C flare-up. The flare-up was so severe that it required bed rest for three days and a saline treatment. The patient attributes the flare-up to poor dietary habits.  The patient also reports feeling "woozy" and dizzy, which may be related to his low blood pressure. He has been managing her neurologic issues with a combination of Lyrica and Ativan, which may have contributed to her current symptoms.  The patient also reports ongoing pain in the abdomen, particularly during IBS flare-ups. He has a history of sinus infections, with a severe infection earlier in the year that developed into a fungal infection. Currently, he is experiencing sinus pressure and congestion.  Medications: Outpatient Medications Prior to Visit  Medication Sig   b complex vitamins capsule Take 1 capsule by mouth daily.   fluticasone (FLONASE) 50 MCG/ACT nasal spray Place 2 sprays into both nostrils daily.    gabapentin (NEURONTIN) 800 MG tablet Take 1 tablet (800 mg total) by mouth 3 (three) times daily.   LORazepam (ATIVAN) 0.5 MG tablet TAKE 1 TABLET BY MOUTH DAILY AS NEEDED   losartan-hydrochlorothiazide (HYZAAR) 50-12.5 MG tablet Take 1 tablet by mouth daily.   metoprolol succinate (TOPROL-XL) 50 MG 24 hr tablet Take 1 tablet (50 mg total) by mouth daily. Take with or immediately following a meal.   Multiple Vitamin (MULTIVITAMIN) capsule Take 1 capsule by mouth daily.   omeprazole (PRILOSEC) 40 MG capsule TAKE 1 CAPSULE BY MOUTH IN THE MORNING AND AT BEDTIME   rosuvastatin (CRESTOR) 10 MG tablet TAKE 1 TABLET BY MOUTH ONCE DAILY   traMADol (ULTRAM) 50 MG tablet Take 1 tablet by mouth 2 (two) times daily.   traZODone (DESYREL) 50 MG tablet Take 1 tablet (50 mg total) by mouth at bedtime.   pregabalin (LYRICA) 50 MG capsule Take 50 mg by mouth 3 (three) times daily.   No facility-administered medications prior to visit.     Objective    BP 100/68 (BP Location: Right Arm, Patient Position: Sitting, Cuff Size: Large)   Pulse 93   Temp 98.2 F (36.8 C) (Oral)   Resp 18   Ht 6\' 2"  (1.88 m)   Wt 277 lb (125.6 kg)   BMI 35.56 kg/m   Physical Exam Vitals and nursing note reviewed.  Constitutional:      General: He is not in  acute distress.    Appearance: Normal appearance. He is obese. He is not ill-appearing, toxic-appearing or diaphoretic.  HENT:     Head: Normocephalic and atraumatic.     Right Ear: Tympanic membrane, ear canal and external ear normal.     Left Ear: Tympanic membrane and external ear normal.     Nose: Congestion and rhinorrhea present.     Mouth/Throat:     Mouth: Mucous membranes are moist.     Pharynx: Oropharynx is clear. No oropharyngeal exudate or posterior oropharyngeal erythema.  Eyes:     Conjunctiva/sclera: Conjunctivae normal.  Cardiovascular:     Rate and Rhythm: Normal rate and regular rhythm.     Pulses: Normal pulses.     Heart sounds: Normal heart  sounds.  Pulmonary:     Effort: Pulmonary effort is normal.     Breath sounds: Normal breath sounds.  Abdominal:     Tenderness: There is abdominal tenderness.     Comments: Resolving LLQ tenderness   Musculoskeletal:        General: Normal range of motion.     Cervical back: Normal range of motion and neck supple. No tenderness.  Lymphadenopathy:     Cervical: No cervical adenopathy.  Skin:    General: Skin is warm and dry.     Capillary Refill: Capillary refill takes less than 2 seconds.  Neurological:     General: No focal deficit present.     Mental Status: He is alert and oriented to person, place, and time. Mental status is at baseline.  Psychiatric:        Mood and Affect: Mood normal.        Behavior: Behavior normal.        Thought Content: Thought content normal.        Judgment: Judgment normal.    No results found for any visits on 12/29/22.  Assessment & Plan     Problem List Items Addressed This Visit   None  Hypotension Recent episodes of low blood pressure potentially due to dehydration and medication side effects. No significant changes in weight or blood sugar. Thyroid well controlled. -Discontinue Losartan/Hydrochlorothiazide combination. -Start Losartan 25mg . -Check blood pressure in 1 month.  Sinusitis Complaints of sinus pressure and pain. No fever or chills. -Prescribe antibiotic. -Check symptoms in 1 month. -Continue rhinitis care, new Rx provided.  Irritable Bowel Syndrome with Constipation (IBS-C) Recent severe flare-up leading to bed rest for three days. Currently stable with residual abdominal pain. -Encourage appropriate diet. -Check symptoms in 1 month.  General Health Maintenance -Complete labs to check for any electrolyte issues and chronic maintenance. -Consider flu vaccine at next visit if symptoms have resolved. No follow-ups on file.      Leilani Merl, FNP, have reviewed all documentation for this visit. The documentation  on 12/29/22 for the exam, diagnosis, procedures, and orders are all accurate and complete.  Jacky Kindle, FNP  [facility] [phone] (phone) [phone] (fax)  [facility]

## 2022-12-30 LAB — COMPREHENSIVE METABOLIC PANEL
ALT: 29 [IU]/L (ref 0–44)
AST: 28 [IU]/L (ref 0–40)
Albumin: 4.6 g/dL (ref 3.9–4.9)
Alkaline Phosphatase: 66 [IU]/L (ref 44–121)
BUN/Creatinine Ratio: 14 (ref 10–24)
BUN: 14 mg/dL (ref 8–27)
Bilirubin Total: 0.6 mg/dL (ref 0.0–1.2)
CO2: 22 mmol/L (ref 20–29)
Calcium: 9.9 mg/dL (ref 8.6–10.2)
Chloride: 101 mmol/L (ref 96–106)
Creatinine, Ser: 0.98 mg/dL (ref 0.76–1.27)
Globulin, Total: 2.6 g/dL (ref 1.5–4.5)
Glucose: 96 mg/dL (ref 70–99)
Potassium: 4.2 mmol/L (ref 3.5–5.2)
Sodium: 141 mmol/L (ref 134–144)
Total Protein: 7.2 g/dL (ref 6.0–8.5)
eGFR: 87 mL/min/{1.73_m2} (ref >59)

## 2022-12-30 LAB — CBC WITH DIFFERENTIAL/PLATELET
Basophils Absolute: 0.1 10*3/uL (ref 0.0–0.2)
Basos: 1 %
EOS (ABSOLUTE): 0.2 10*3/uL (ref 0.0–0.4)
Eos: 3 %
Hematocrit: 47.4 % (ref 37.5–51.0)
Hemoglobin: 15.5 g/dL (ref 13.0–17.7)
Immature Grans (Abs): 0 10*3/uL (ref 0.0–0.1)
Immature Granulocytes: 0 %
Lymphocytes Absolute: 1.6 10*3/uL (ref 0.7–3.1)
Lymphs: 23 %
MCH: 32.7 pg (ref 26.6–33.0)
MCHC: 32.7 g/dL (ref 31.5–35.7)
MCV: 100 fL — ABNORMAL HIGH (ref 79–97)
Monocytes Absolute: 0.5 10*3/uL (ref 0.1–0.9)
Monocytes: 7 %
Neutrophils Absolute: 4.8 10*3/uL (ref 1.4–7.0)
Neutrophils: 66 %
Platelets: 180 10*3/uL (ref 150–450)
RBC: 4.74 x10E6/uL (ref 4.14–5.80)
RDW: 12.1 % (ref 11.6–15.4)
WBC: 7.3 10*3/uL (ref 3.4–10.8)

## 2022-12-30 LAB — LIPID PANEL
Chol/HDL Ratio: 2.3 ratio (ref 0.0–5.0)
Cholesterol, Total: 152 mg/dL (ref 100–199)
HDL: 65 mg/dL (ref >39)
LDL Chol Calc (NIH): 61 mg/dL (ref 0–99)
Triglycerides: 157 mg/dL — ABNORMAL HIGH (ref 0–149)
VLDL Cholesterol Cal: 26 mg/dL (ref 5–40)

## 2022-12-30 LAB — HEMOGLOBIN A1C
Est. average glucose Bld gHb Est-mCnc: 108 mg/dL
Hgb A1c MFr Bld: 5.4 % (ref 4.8–5.6)

## 2022-12-30 LAB — VITAMIN D 25 HYDROXY (VIT D DEFICIENCY, FRACTURES): Vit D, 25-Hydroxy: 37.7 ng/mL (ref 30.0–100.0)

## 2022-12-30 LAB — B12 AND FOLATE PANEL
Folate: >20 ng/mL (ref >3.0)
Vitamin B-12: 629 pg/mL (ref 232–1245)

## 2022-12-30 LAB — TSH: TSH: 1.17 u[IU]/mL (ref 0.450–4.500)

## 2022-12-30 LAB — PSA: Prostate Specific Ag, Serum: 0.5 ng/mL (ref 0.0–4.0)

## 2023-01-06 ENCOUNTER — Other Ambulatory Visit: Payer: Self-pay | Admitting: Family Medicine

## 2023-01-06 DIAGNOSIS — F4001 Agoraphobia with panic disorder: Secondary | ICD-10-CM

## 2023-01-06 NOTE — Telephone Encounter (Signed)
Please advise 

## 2023-01-28 ENCOUNTER — Ambulatory Visit: Payer: 59 | Admitting: Family Medicine

## 2023-01-28 ENCOUNTER — Encounter: Payer: Self-pay | Admitting: Family Medicine

## 2023-01-28 VITALS — BP 137/78 | HR 71 | Resp 16 | Ht 74.0 in | Wt 286.9 lb

## 2023-01-28 DIAGNOSIS — I10 Essential (primary) hypertension: Secondary | ICD-10-CM

## 2023-01-28 NOTE — Patient Instructions (Signed)
The CDC recommends two doses of Shingrix (the new shingles vaccine) separated by 2 to 6 months for adults age 63 years and older. I recommend checking with your insurance plan regarding coverage for this vaccine.    

## 2023-01-28 NOTE — Assessment & Plan Note (Signed)
Chronic, borderline today; however, previously low normal and pt symptomatic Continue dietary assistance to assist in meeting goal of 119/79  Continue losartan 25 mg Continue Toprol 50 mg

## 2023-01-28 NOTE — Progress Notes (Signed)
Established patient visit  Patient: Shaun Griffith   DOB: 11-19-1960   62 y.o. Male  MRN: [id number] Visit Date: 01/28/2023  Today's healthcare provider: Jacky Kindle, FNP  Re Introduced to nurse practitioner role and practice setting.  All questions answered.  Discussed provider/patient relationship and expectations.  Chief Complaint  Patient presents with   Medical Management of Chronic Issues    HTN   Subjective    HPI HPI     Medical Management of Chronic Issues    Additional comments: HTN      Last edited by Marjie Skiff, CMA on 01/28/2023  1:32 PM.      Medications: Outpatient Medications Prior to Visit  Medication Sig   azelastine (ASTELIN) 0.1 % nasal spray Place 2 sprays into both nostrils 2 (two) times daily.   fluticasone (FLONASE) 50 MCG/ACT nasal spray Place 2 sprays into both nostrils daily.   gabapentin (NEURONTIN) 800 MG tablet Take 1 tablet (800 mg total) by mouth 3 (three) times daily.   LORazepam (ATIVAN) 0.5 MG tablet TAKE 1 TABLET BY MOUTH DAILY AS NEEDED   losartan (COZAAR) 25 MG tablet Take 1 tablet (25 mg total) by mouth daily.   metoprolol succinate (TOPROL-XL) 50 MG 24 hr tablet Take 1 tablet (50 mg total) by mouth daily. Take with or immediately following a meal.   Multiple Vitamin (MULTIVITAMIN) capsule Take 1 capsule by mouth daily.   omeprazole (PRILOSEC) 40 MG capsule TAKE 1 CAPSULE BY MOUTH IN THE MORNING AND AT BEDTIME   rosuvastatin (CRESTOR) 10 MG tablet TAKE 1 TABLET BY MOUTH ONCE DAILY   traZODone (DESYREL) 50 MG tablet Take 1 tablet (50 mg total) by mouth at bedtime.   Azelastine-Fluticasone 137-50 MCG/ACT SUSP Place 1 spray into the nose every 12 (twelve) hours.   azithromycin (ZITHROMAX) 500 MG tablet Take 1 tablet (500 mg total) by mouth daily.   b complex vitamins capsule Take 1 capsule by mouth daily.   ciprofloxacin-dexamethasone (CIPRODEX) OTIC suspension Place 4 drops into the right ear 2 (two) times daily.    levocetirizine (XYZAL) 5 MG tablet Take 1 tablet (5 mg total) by mouth every evening. (Patient not taking: Reported on 01/28/2023)   predniSONE (DELTASONE) 10 MG tablet Take 10 mg by mouth daily with breakfast.   pregabalin (LYRICA) 50 MG capsule Take 50 mg by mouth 3 (three) times daily.   sertraline (ZOLOFT) 50 MG tablet Take 1 tablet by mouth daily.   traMADol (ULTRAM) 50 MG tablet Take 1 tablet by mouth 2 (two) times daily.   No facility-administered medications prior to visit.     Objective    BP 137/78 (BP Location: Right Arm, Patient Position: Sitting, Cuff Size: Large)   Pulse 71   Resp 16   Ht 6\' 2"  (1.88 m)   Wt 286 lb 14.4 oz (130.1 kg)   BMI 36.84 kg/m   Physical Exam Vitals and nursing note reviewed.  Constitutional:      Appearance: Normal appearance. He is obese.  HENT:     Head: Normocephalic and atraumatic.  Cardiovascular:     Rate and Rhythm: Normal rate and regular rhythm.     Pulses: Normal pulses.     Heart sounds: Normal heart sounds.  Pulmonary:     Effort: Pulmonary effort is normal.     Breath sounds: Normal breath sounds.  Musculoskeletal:        General: Normal range of motion.     Cervical back: Normal  range of motion.  Skin:    General: Skin is warm and dry.     Capillary Refill: Capillary refill takes less than 2 seconds.  Neurological:     General: No focal deficit present.     Mental Status: He is alert and oriented to person, place, and time. Mental status is at baseline.  Psychiatric:        Mood and Affect: Mood normal.        Behavior: Behavior normal.        Thought Content: Thought content normal.        Judgment: Judgment normal.      No results found for any visits on 01/28/23.  Assessment & Plan     Problem List Items Addressed This Visit       Cardiovascular and Mediastinum   Primary hypertension - Primary    Chronic, borderline today; however, previously low normal and pt symptomatic Continue dietary assistance to  assist in meeting goal of 119/79  Continue losartan 25 mg Continue Toprol 50 mg      Return in about 3 months (around 04/30/2023), or if symptoms worsen or fail to improve, for annual examination.     Leilani Merl, FNP, have reviewed all documentation for this visit. The documentation on 01/28/23 for the exam, diagnosis, procedures, and orders are all accurate and complete.  Jacky Kindle, FNP  [facility] [phone] (phone) [phone] (fax)  [facility]

## 2023-02-19 ENCOUNTER — Other Ambulatory Visit: Payer: Self-pay | Admitting: Family Medicine

## 2023-02-19 DIAGNOSIS — E781 Pure hyperglyceridemia: Secondary | ICD-10-CM

## 2023-02-19 NOTE — Telephone Encounter (Signed)
Requested Prescriptions  Pending Prescriptions Disp Refills   rosuvastatin (CRESTOR) 10 MG tablet [Pharmacy Med Name: ROSUVASTATIN CALCIUM 10 MG TAB] 90 tablet 0    Sig: TAKE 1 TABLET BY MOUTH ONCE DAILY     Cardiovascular:  Antilipid - Statins 2 Failed - 02/19/2023  3:07 PM      Failed - Lipid Panel in normal range within the last 12 months    Cholesterol, Total  Date Value Ref Range Status  12/29/2022 152 100 - 199 mg/dL Final   LDL Cholesterol (Calc)  Date Value Ref Range Status  12/31/2016 83 mg/dL (calc) Final    Comment:    Reference range: <100 . Desirable range <100 mg/dL for primary prevention;   <70 mg/dL for patients with CHD or diabetic patients  with > or = 2 CHD risk factors. Marland Kitchen LDL-C is now calculated using the Martin-Hopkins  calculation, which is a validated novel method providing  better accuracy than the Friedewald equation in the  estimation of LDL-C.  Horald Pollen et al. Lenox Ahr. 1027;253(66): 2061-2068  (http://education.[employer].com/faq/FAQ164)    LDL Chol Calc (NIH)  Date Value Ref Range Status  12/29/2022 61 0 - 99 mg/dL Final   HDL  Date Value Ref Range Status  12/29/2022 65 >39 mg/dL Final   Triglycerides  Date Value Ref Range Status  12/29/2022 157 (H) 0 - 149 mg/dL Final         Passed - Cr in normal range and within 360 days    Creat  Date Value Ref Range Status  12/31/2016 0.73 0.70 - 1.33 mg/dL Final    Comment:    For patients >60 years of age, the reference limit for Creatinine is approximately 13% higher for people identified as African-American. .    Creatinine, Ser  Date Value Ref Range Status  12/29/2022 0.98 0.76 - 1.27 mg/dL Final         Passed - Patient is not pregnant      Passed - Valid encounter within last 12 months    Recent Outpatient Visits           3 weeks ago Primary hypertension   [place] [facility] Merita Norton T, FNP   1 month ago Acute recurrent pansinusitis   [place] Jacky Kindle, FNP   4 months ago Chronic fatigue   [facility] Jacky Kindle, FNP   10 months ago Essential hypertension   [place] [facility] Jacky Kindle, FNP   1 year ago Essential hypertension   [place] [facility] Jacky Kindle, [place]

## 2023-02-22 ENCOUNTER — Encounter: Payer: Self-pay | Admitting: Family Medicine

## 2023-02-25 ENCOUNTER — Other Ambulatory Visit: Payer: Self-pay | Admitting: Family Medicine

## 2023-02-25 DIAGNOSIS — K581 Irritable bowel syndrome with constipation: Secondary | ICD-10-CM

## 2023-03-08 ENCOUNTER — Other Ambulatory Visit: Payer: Self-pay | Admitting: Family Medicine

## 2023-03-08 DIAGNOSIS — J0141 Acute recurrent pansinusitis: Secondary | ICD-10-CM

## 2023-03-13 ENCOUNTER — Other Ambulatory Visit: Payer: Self-pay | Admitting: Family Medicine

## 2023-03-13 DIAGNOSIS — F4001 Agoraphobia with panic disorder: Secondary | ICD-10-CM

## 2023-03-15 MED ORDER — LORAZEPAM 0.5 MG PO TABS: 0.5000 mg | ORAL_TABLET | Freq: Every day | ORAL | 0 refills | Status: DC | PRN

## 2023-03-16 ENCOUNTER — Ambulatory Visit: Payer: Self-pay

## 2023-03-16 ENCOUNTER — Telehealth: Payer: Self-pay | Admitting: Family Medicine

## 2023-03-16 DIAGNOSIS — I10 Essential (primary) hypertension: Secondary | ICD-10-CM

## 2023-03-16 NOTE — Telephone Encounter (Signed)
  Chief Complaint: HTN Symptoms: BP 154/90, HA Frequency: several days Pertinent Negatives: Patient denies dizziness or chest pain Disposition: [] ED /[] Urgent Care (no appt availability in office) / [x] Appointment(In office/virtual)/ []  [facility]/ [] Home Care/ [] Refused Recommended Disposition /[] [facility]/ [x]  Follow-up with PCP Additional Notes: pt states he had changes to BP meds and wanting to see if can go back to taking previous medication. OV on 12/29/22, losartan  hydrochlorothiazide  50-12.5mg  was dc'd and losartan  25mg  was started. Pt has noticed increase in BP and wanting to try the osartan hydrochlorothiazide  50-12.5mg  again. Went ahead and scheduled FU appt for 04/07/23 at 1040 with Dr. Donzella advised pt I would send message back for advice on HTN med.   Summary: bp concern   Pt concerned about bp 154/90 no other symptoms mentioned. He is asking about increasing bp med back         Reason for Disposition  [1] Systolic BP  >= 130 OR Diastolic >= 80 AND [2] taking BP medications  Answer Assessment - Initial Assessment Questions 1. BLOOD PRESSURE: What is the blood pressure? Did you take at least two measurements 5 minutes apart?     154/90 2. ONSET: When did you take your blood pressure?     Several days  3. HOW: How did you take your blood pressure? (e.g., automatic home BP monitor, visiting nurse)     Home BP  4. HISTORY: Do you have a history of high blood pressure?     yes 5. MEDICINES: Are you taking any medicines for blood pressure? Have you missed any doses recently?     Losartan  and metoprolol   6. OTHER SYMPTOMS: Do you have any symptoms? (e.g., blurred vision, chest pain, difficulty breathing, headache, weakness)     HA  Protocols used: Blood Pressure - High-A-AH

## 2023-03-16 NOTE — Telephone Encounter (Signed)
 [facility] is requesting prior authorization Key: [id number] Azelastine-Fluticasone 137-50MCG/ACT Suspension

## 2023-03-17 NOTE — Telephone Encounter (Signed)
 Recieved a fax from covermymeds for Azelastine-Fluticasone 137-50MCG/ACT Suspension .Marland KitchenPatient is waiting for medication.   key: [id number]

## 2023-03-18 MED ORDER — LOSARTAN POTASSIUM 50 MG PO TABS: 50.0000 mg | ORAL_TABLET | Freq: Every day | ORAL | 0 refills | Status: DC

## 2023-03-18 NOTE — Addendum Note (Signed)
 Addended by: Jacquenette Shone on: 03/18/2023 04:02 PM   Modules accepted: Orders

## 2023-04-05 ENCOUNTER — Ambulatory Visit: Payer: 59 | Admitting: Family Medicine

## 2023-04-07 ENCOUNTER — Encounter: Payer: Self-pay | Admitting: Family Medicine

## 2023-04-07 ENCOUNTER — Ambulatory Visit: Payer: BLUE CROSS/BLUE SHIELD | Admitting: Family Medicine

## 2023-04-07 VITALS — BP 127/88 | HR 91 | Ht 74.0 in | Wt 288.0 lb

## 2023-04-07 DIAGNOSIS — F332 Major depressive disorder, recurrent severe without psychotic features: Secondary | ICD-10-CM | POA: Diagnosis not present

## 2023-04-07 DIAGNOSIS — I10 Essential (primary) hypertension: Secondary | ICD-10-CM | POA: Diagnosis not present

## 2023-04-07 MED ORDER — ESCITALOPRAM OXALATE 10 MG PO TABS: 10.0000 mg | ORAL_TABLET | Freq: Every day | ORAL | 3 refills | Status: DC

## 2023-04-07 MED ORDER — LOSARTAN POTASSIUM 100 MG PO TABS: 100.0000 mg | ORAL_TABLET | Freq: Every day | ORAL | 3 refills | Status: AC

## 2023-04-07 NOTE — Assessment & Plan Note (Signed)
PHQ-9 score 18 GAD-7 score 13 Previous trials of Zoloft, Paxil, and Prozac. Zoloft caused adverse effects, Paxil was ineffective, and response to Prozac is unclear. Considering starting Lexapro. Discussed trial and error nature of antidepressant therapy and the 4-6 weeks required to assess effectiveness. - Start Lexapro - Monitor for effectiveness and side effects - Follow up in 4 weeks

## 2023-04-07 NOTE — Telephone Encounter (Signed)
Received a fax from covermymeds for Azelastine-Fluticasone 137-12mcg/act suspension  Key: [id number]

## 2023-04-07 NOTE — Patient Instructions (Addendum)
Get your shingles vaccine at the pharmacy.  Check your blood pressure once daily, and any time you have concerning symptoms like headache, chest pain, dizziness, shortness of breath, or vision changes.   Our goal is less than 130/80.  To appropriately check your blood pressure, make sure you do the following:  1) Avoid caffeine, exercise, or tobacco products for 30 minutes before checking. Empty your bladder. 2) Sit with your back supported in a flat-backed chair. Rest your arm on something flat (arm of the chair, table, etc). 3) Sit still with your feet flat on the floor, resting, for at least 5 minutes.  4) Check your blood pressure. Take 1-2 readings.  5) Write down these readings and bring with you to any provider appointments.  Bring your home blood pressure machine with you to a provider's office for accuracy comparison at least once a year.   Make sure you take your blood pressure medications before you come to any office visit, even if you were asked to fast for labs.   Start losartan 100 mg and check your blood pressures regularly. If you start having dizziness again and your blood pressures are low when this happens, send a message with this information, requesting to try losartan 75 mg.

## 2023-04-07 NOTE — Progress Notes (Signed)
Established patient visit   Patient: Shaun Griffith   DOB: Apr 06, 1960   63 y.o. Male  MRN: [id number] Visit Date: 04/07/2023  Today's healthcare provider: Sherlyn Hay, DO   Chief Complaint  Patient presents with   Hypertension   Depression   Subjective    Hypertension Pertinent negatives include no chest pain or shortness of breath.  Depression        The patient presents with elevated blood pressure and recurrent depression symptoms.  He has elevated blood pressure, with home readings reaching up to 160 mmHg and a recent reading of 173/98 mmHg. He uses a wrist cuff at home for monitoring. He was previously on a combination of losartan and hydrochlorothiazide but is currently only on losartan and metoprolol. He recalls experiencing dizziness when on a higher dose of losartan combined with hydrochlorothiazide.  - Currently on losartan 25 and Toprol-XL 50  - Prev on losartan/hctz 50-12.5 and Toprol-XL 50   He is experiencing a recurrence of depression symptoms and is interested in restarting antidepressant medication. He has previously tried:   - Paxil (wasn't effective)  - Prozac (tried 30 years ago; friends said he didn't act the same; he otherwise doesn't remember why it was stopped)   - Zoloft (didn't like how it made him feel; made him feel "strange")     Medications: Outpatient Medications Prior to Visit  Medication Sig   Azelastine-Fluticasone 137-50 MCG/ACT SUSP Place 1 spray into the nose every 12 (twelve) hours.   Cholecalciferol (VITAMIN D3) 50 MCG (2000 UT) capsule Take 2,000 Units by mouth daily.   gabapentin (NEURONTIN) 800 MG tablet Take 1 tablet (800 mg total) by mouth 3 (three) times daily.   levocetirizine (XYZAL) 5 MG tablet Take 1 tablet (5 mg total) by mouth every evening.   LORazepam (ATIVAN) 0.5 MG tablet Take 1 tablet (0.5 mg total) by mouth daily as needed.   metoprolol succinate (TOPROL-XL) 50 MG 24 hr tablet Take 1 tablet (50 mg total) by  mouth daily. Take with or immediately following a meal.   Multiple Vitamin (MULTIVITAMIN) capsule Take 1 capsule by mouth daily.   omeprazole (PRILOSEC) 40 MG capsule TAKE 1 CAPSULE BY MOUTH IN THE MORNING AND AT BEDTIME   rosuvastatin (CRESTOR) 10 MG tablet TAKE 1 TABLET BY MOUTH ONCE DAILY   [DISCONTINUED] losartan (COZAAR) 50 MG tablet Take 1 tablet (50 mg total) by mouth daily.   No facility-administered medications prior to visit.    Review of Systems  Eyes:  Negative for visual disturbance.  Respiratory:  Negative for shortness of breath.   Cardiovascular:  Negative for chest pain.  Psychiatric/Behavioral:  Positive for depression.         Objective    BP 127/88 (BP Location: Right Arm, Patient Position: Sitting, Cuff Size: Large) Comment (Cuff Size): on forearm, marker aimed toward brachial artery  Pulse 91   Ht 6\' 2"  (1.88 m)   Wt 288 lb (130.6 kg)   SpO2 97%   BMI 36.98 kg/m     Physical Exam Vitals and nursing note reviewed.  Constitutional:      General: He is not in acute distress.    Appearance: Normal appearance.  HENT:     Head: Normocephalic and atraumatic.  Eyes:     General: No scleral icterus.    Conjunctiva/sclera: Conjunctivae normal.  Cardiovascular:     Rate and Rhythm: Normal rate.  Pulmonary:     Effort: Pulmonary effort is normal.  Neurological:     Mental Status: He is alert and oriented to person, place, and time. Mental status is at baseline.  Psychiatric:        Mood and Affect: Mood normal.        Behavior: Behavior normal.      No results found for any visits on 04/07/23.  Assessment & Plan    Moderately severe recurrent major depression (HCC) Assessment & Plan: PHQ-9 score 18 GAD-7 score 13 Previous trials of Zoloft, Paxil, and Prozac. Zoloft caused adverse effects, Paxil was ineffective, and response to Prozac is unclear. Considering starting Lexapro. Discussed trial and error nature of antidepressant therapy and the 4-6  weeks required to assess effectiveness. - Start Lexapro - Monitor for effectiveness and side effects - Follow up in 4 weeks  Orders: -     Escitalopram Oxalate; Take 1 tablet (10 mg total) by mouth daily.  Dispense: 90 tablet; Refill: 3  Primary hypertension Assessment & Plan: Blood pressure readings have been elevated both at home (up to 160s) and in the clinic (173/98). Currently on losartan 50 mg and metoprolol. Previous combination therapy with losartan and hydrochlorothiazide caused dizziness. Discussed increasing losartan to 100 mg or adding back hydrochlorothiazide. Patient prefers to increase losartan to 100 mg and monitor for dizziness. - Start losartan 100 mg - Monitor blood pressure at home - If dizziness occurs and blood pressure is low, reduce losartan to 75 mg - Send prescription for losartan 100 mg - Follow up in 4 weeks  Orders: -     Losartan Potassium; Take 1 tablet (100 mg total) by mouth daily.  Dispense: 90 tablet; Refill: 3  General Health Maintenance Due for COVID booster and shingles vaccine. Declined COVID booster.  Recommended checking with the pharmacy for vaccine administration due to insurance coverage issues. - Declined COVID booster - Recommend shingles vaccine at pharmacy - Check with pharmacy for vaccine administration   Return in about 4 weeks (around 05/05/2023) for Anx/Dep, HTN.      I discussed the assessment and treatment plan with the patient  The patient was provided an opportunity to ask questions and all were answered. The patient agreed with the plan and demonstrated an understanding of the instructions.   The patient was advised to call back or seek an in-person evaluation if the symptoms worsen or if the condition fails to improve as anticipated.    Sherlyn Hay, DO  [facility] [phone] (phone) [phone] (fax)  [facility]

## 2023-04-07 NOTE — Assessment & Plan Note (Signed)
Blood pressure readings have been elevated both at home (up to 160s) and in the clinic (173/98). Currently on losartan 50 mg and metoprolol. Previous combination therapy with losartan and hydrochlorothiazide caused dizziness. Discussed increasing losartan to 100 mg or adding back hydrochlorothiazide. Patient prefers to increase losartan to 100 mg and monitor for dizziness. - Start losartan 100 mg - Monitor blood pressure at home - If dizziness occurs and blood pressure is low, reduce losartan to 75 mg - Send prescription for losartan 100 mg - Follow up in 4 weeks

## 2023-04-09 ENCOUNTER — Telehealth: Payer: Self-pay | Admitting: Family Medicine

## 2023-04-09 NOTE — Telephone Encounter (Signed)
Covermymeds is requesting PA Key: [id number] Azelastine-Fluticasone 137-50MCG/ACT Suspension

## 2023-04-13 ENCOUNTER — Encounter: Payer: Self-pay | Admitting: Family Medicine

## 2023-04-13 DIAGNOSIS — K581 Irritable bowel syndrome with constipation: Secondary | ICD-10-CM

## 2023-04-13 NOTE — Telephone Encounter (Signed)
OK to place new referral? Thank you!

## 2023-04-13 NOTE — Telephone Encounter (Signed)
Per [facility] this prescription was transferred to [facility] pharmacy.

## 2023-04-27 ENCOUNTER — Telehealth: Payer: Self-pay | Admitting: Family Medicine

## 2023-04-27 DIAGNOSIS — E781 Pure hyperglyceridemia: Secondary | ICD-10-CM

## 2023-04-27 MED ORDER — GABAPENTIN 800 MG PO TABS: 800.0000 mg | ORAL_TABLET | Freq: Three times a day (TID) | ORAL | 0 refills | Status: AC

## 2023-04-27 MED ORDER — ROSUVASTATIN CALCIUM 10 MG PO TABS: 10.0000 mg | ORAL_TABLET | Freq: Every day | ORAL | 0 refills | Status: DC

## 2023-04-27 NOTE — Telephone Encounter (Signed)
[facility] faxed refill request for the following medications:   rosuvastatin (CRESTOR) 10 MG tablet    gabapentin (NEURONTIN) 800 MG tablet    Please advise.

## 2023-05-03 ENCOUNTER — Telehealth: Payer: Self-pay

## 2023-05-03 ENCOUNTER — Other Ambulatory Visit (HOSPITAL_COMMUNITY): Payer: Self-pay

## 2023-05-03 ENCOUNTER — Telehealth: Payer: Self-pay | Admitting: Family Medicine

## 2023-05-03 NOTE — Telephone Encounter (Signed)
 Pharmacy Patient Advocate Encounter   Received notification from Pt Calls Messages that prior authorization for Azelastine-Fluticasone 137-50MCG/ACT suspension is required/requested.   Insurance verification completed.   The patient is insured through [facility] .   Per test claim: PA required; PA submitted to above mentioned insurance via CoverMyMeds Key/confirmation #/EOC [id number] Status is pending

## 2023-05-03 NOTE — Telephone Encounter (Signed)
 Covermymeds is requesting prior authorization Key: [id number] Azelastine-Fluticasone 137-50MCG/ACT Suspension

## 2023-05-05 ENCOUNTER — Ambulatory Visit (INDEPENDENT_AMBULATORY_CARE_PROVIDER_SITE_OTHER): Payer: BLUE CROSS/BLUE SHIELD | Admitting: Family Medicine

## 2023-05-05 VITALS — BP 128/81 | HR 78 | Resp 18 | Ht 74.0 in | Wt 282.1 lb

## 2023-05-05 DIAGNOSIS — F419 Anxiety disorder, unspecified: Secondary | ICD-10-CM

## 2023-05-05 DIAGNOSIS — E781 Pure hyperglyceridemia: Secondary | ICD-10-CM

## 2023-05-05 DIAGNOSIS — F332 Major depressive disorder, recurrent severe without psychotic features: Secondary | ICD-10-CM | POA: Diagnosis not present

## 2023-05-05 DIAGNOSIS — I10 Essential (primary) hypertension: Secondary | ICD-10-CM

## 2023-05-05 DIAGNOSIS — K581 Irritable bowel syndrome with constipation: Secondary | ICD-10-CM

## 2023-05-05 DIAGNOSIS — E7800 Pure hypercholesterolemia, unspecified: Secondary | ICD-10-CM | POA: Diagnosis not present

## 2023-05-05 MED ORDER — ESCITALOPRAM OXALATE 20 MG PO TABS: 20.0000 mg | ORAL_TABLET | Freq: Every day | ORAL | 0 refills | Status: DC

## 2023-05-05 MED ORDER — ROSUVASTATIN CALCIUM 10 MG PO TABS: 10.0000 mg | ORAL_TABLET | Freq: Every day | ORAL | 0 refills | Status: DC

## 2023-05-05 NOTE — Assessment & Plan Note (Signed)
 Reports improvement on Lexapro 10 mg but desires further symptom control. Depression scale screening shows improvement. Discussed increasing dosage. - Increase Lexapro dosage to 20 mg

## 2023-05-05 NOTE — Progress Notes (Unsigned)
 Established patient visit   Patient: Shaun Griffith   DOB: [birth year]   63 y.o. Male  MRN: [id number] Visit Date: 05/05/2023  Today's healthcare provider: Sherlyn Hay, DO   Chief Complaint  Patient presents with  . Anxiety  . Depression  . Hypertension   Subjective    Anxiety Patient reports no chest pain, palpitations or shortness of breath.    Depression        Associated symptoms include no headaches.  Past medical history includes anxiety.   Hypertension Associated symptoms include anxiety. Pertinent negatives include no chest pain, headaches, palpitations or shortness of breath.    Shaun Griffith "[name]" is a 63 year old male with hypertension and anxiety who presents for follow-up on Lexapro and blood pressure management.  He started Lexapro and feels 'pretty good' on it, though he desires further improvement and is considering a dosage increase. Currently, he takes 10 mg of Lexapro. Lexapro appears to help with his anxiety, especially in social situations, but he still uses Ativan as needed, particularly in environments with high ceilings.  He monitors his blood pressure at home, with recent readings of 144/87 mmHg and 163/90 mmHg. He brought his blood pressure machine for comparison, and in-office measurement was 128/81 mmHg.  He experiences issues with IBS and had requested a referral to [facility], which was denied due to availability. He is seeking alternative management options for his IBS.  He has not yet received the shingles vaccine but plans to do so soon.  He is on rosuvastatin for hyperlipidemia, with a 90-day supply recently sent, and will need a refill soon.   ***  {History (Optional):23778}  Medications: Outpatient Medications Prior to Visit  Medication Sig  . Azelastine-Fluticasone 137-50 MCG/ACT SUSP Place 1 spray into the nose every 12 (twelve) hours.  . Bacillus Coagulans-Inulin (ALIGN PREBIOTIC-PROBIOTIC PO) Take 1 capsule by mouth daily.   . Cholecalciferol (VITAMIN D3) 50 MCG (2000 UT) capsule Take 2,000 Units by mouth daily.  Tery Sanfilippo Calcium (STOOL SOFTENER PO) Take by mouth as needed.  Marland Kitchen escitalopram (LEXAPRO) 10 MG tablet Take 1 tablet (10 mg total) by mouth daily.  Marland Kitchen gabapentin (NEURONTIN) 800 MG tablet Take 1 tablet (800 mg total) by mouth 3 (three) times daily.  Marland Kitchen levocetirizine (XYZAL) 5 MG tablet Take 1 tablet (5 mg total) by mouth every evening.  Marland Kitchen LORazepam (ATIVAN) 0.5 MG tablet Take 1 tablet (0.5 mg total) by mouth daily as needed.  Marland Kitchen losartan (COZAAR) 100 MG tablet Take 1 tablet (100 mg total) by mouth daily.  . metoprolol succinate (TOPROL-XL) 50 MG 24 hr tablet Take 1 tablet (50 mg total) by mouth daily. Take with or immediately following a meal.  . Multiple Vitamin (MULTIVITAMIN) capsule Take 1 capsule by mouth daily.  Marland Kitchen omeprazole (PRILOSEC) 40 MG capsule TAKE 1 CAPSULE BY MOUTH IN THE MORNING AND AT BEDTIME  . rosuvastatin (CRESTOR) 10 MG tablet Take 1 tablet (10 mg total) by mouth daily.   No facility-administered medications prior to visit.    Review of Systems  Respiratory: Negative.  Negative for cough, shortness of breath and wheezing.   Cardiovascular:  Negative for chest pain, palpitations and leg swelling.  Neurological:  Negative for weakness and headaches.  Psychiatric/Behavioral:  Positive for depression.     {Insert previous labs (optional):23779} {See past labs  Heme  Chem  Endocrine  Serology  Results Review (optional):1}   Objective    BP 130/84  Pulse 79   Resp 18   Ht 6\' 2"  (1.88 m)   Wt 282 lb 1.6 oz (128 kg)   SpO2 97%   BMI 36.22 kg/m  {Insert last BP/Wt (optional):23777}{See vitals history (optional):1}   Physical Exam Vitals and nursing note reviewed.  Constitutional:      General: He is not in acute distress.    Appearance: Normal appearance.  HENT:     Head: Normocephalic and atraumatic.  Eyes:     General: No scleral icterus.    Conjunctiva/sclera:  Conjunctivae normal.  Cardiovascular:     Rate and Rhythm: Normal rate.  Pulmonary:     Effort: Pulmonary effort is normal.  Neurological:     Mental Status: He is alert and oriented to person, place, and time. Mental status is at baseline.  Psychiatric:        Mood and Affect: Mood normal.        Behavior: Behavior normal.     No results found for any visits on 05/05/23.  Assessment & Plan    There are no diagnoses linked to this encounter. PHQ-9 score 9 and GAD-7 score 6 showing significant improvement  Depression   Anxiety   Hypertension Home blood pressure readings show variability (144/87, 163/90). In-office reading was 128/81, which is acceptable. Discussed that higher readings at home are acceptable if close to in-office readings. - Continue home blood pressure monitoring  Hyperlipidemia Needs a refill of rosuvastatin. - Send a 90-day supply of rosuvastatin  Irritable Bowel Syndrome (IBS) Reports difficulty obtaining a referral to [facility] for IBS management due to lack of availability. Discussed alternative referral options. - Send a message to the referral coordinator to find an alternative referral for IBS management  General Health Maintenance Has not received the Shingrix vaccine yet but plans to get it soon. Recent blood work does not need to be repeated. - Administer Shingrix vaccine - Schedule physical exam within the next three months  Follow-up - Schedule follow-up appointment in three months. ***  No follow-ups on file.      I discussed the assessment and treatment plan with the patient  The patient was provided an opportunity to ask questions and all were answered. The patient agreed with the plan and demonstrated an understanding of the instructions.   The patient was advised to call back or seek an in-person evaluation if the symptoms worsen or if the condition fails to improve as anticipated.    Sherlyn Hay, DO  [facility] [phone] (phone) [phone] (fax)  [facility]

## 2023-05-05 NOTE — Assessment & Plan Note (Signed)
 Uses Ativan PRN for anxiety, particularly when going out. Lexapro appears to be helping with anxiety as well. - Continue current use of Ativan PRN

## 2023-05-06 ENCOUNTER — Encounter: Payer: Self-pay | Admitting: Family Medicine

## 2023-05-06 DIAGNOSIS — K581 Irritable bowel syndrome with constipation: Secondary | ICD-10-CM | POA: Insufficient documentation

## 2023-05-06 DIAGNOSIS — E781 Pure hyperglyceridemia: Secondary | ICD-10-CM | POA: Insufficient documentation

## 2023-05-06 NOTE — Assessment & Plan Note (Signed)
 Needs a refill of rosuvastatin. - Send a 90-day supply of rosuvastatin

## 2023-05-06 NOTE — Assessment & Plan Note (Signed)
 Well-controlled. Home blood pressure cuff validated today. - Continue home blood pressure monitoring

## 2023-05-06 NOTE — Assessment & Plan Note (Signed)
 Addressed as noted above.

## 2023-05-06 NOTE — Assessment & Plan Note (Signed)
 Reports difficulty obtaining a referral to [facility] for IBS management due to lack of availability. Discussed alternative referral options. - Send a message to the referral coordinator to find an alternative referral for IBS management

## 2023-05-07 NOTE — Telephone Encounter (Signed)
 Pharmacy Patient Advocate Encounter  Received notification from [facility] that Prior Authorization for Azelastine-Fluticasone 137-50MCG/ACT suspension has been DENIED.  Full denial letter will be uploaded to the media tab. See denial reason below.   PA #/Case ID/Reference #: [id number]

## 2023-05-17 NOTE — Telephone Encounter (Signed)
 Patient was able to get meds through [facility] using a coupon

## 2023-05-24 ENCOUNTER — Other Ambulatory Visit: Payer: Self-pay | Admitting: Family Medicine

## 2023-05-24 DIAGNOSIS — F4001 Agoraphobia with panic disorder: Secondary | ICD-10-CM

## 2023-05-28 ENCOUNTER — Other Ambulatory Visit (HOSPITAL_COMMUNITY): Payer: Self-pay

## 2023-06-15 ENCOUNTER — Telehealth: Payer: Self-pay | Admitting: Physician Assistant

## 2023-06-15 DIAGNOSIS — B9689 Other specified bacterial agents as the cause of diseases classified elsewhere: Secondary | ICD-10-CM

## 2023-06-15 DIAGNOSIS — J208 Acute bronchitis due to other specified organisms: Secondary | ICD-10-CM

## 2023-06-15 MED ORDER — BENZONATATE 100 MG PO CAPS: 100.0000 mg | ORAL_CAPSULE | Freq: Three times a day (TID) | ORAL | 0 refills | Status: DC | PRN

## 2023-06-15 MED ORDER — DOXYCYCLINE HYCLATE 100 MG PO TABS: 100.0000 mg | ORAL_TABLET | Freq: Two times a day (BID) | ORAL | 0 refills | Status: DC

## 2023-06-15 MED ORDER — ALBUTEROL SULFATE HFA 108 (90 BASE) MCG/ACT IN AERS: 2.0000 | INHALATION_SPRAY | Freq: Four times a day (QID) | RESPIRATORY_TRACT | 0 refills | Status: DC | PRN

## 2023-06-15 NOTE — Progress Notes (Signed)
 E-Visit for Cough   We are sorry that you are not feeling well.  Here is how we plan to help!  Based on your presentation I believe you most likely have A cough due to bacteria.  When patients have a fever and a productive cough with a change in color or increased sputum production, we are concerned about bacterial bronchitis.  If left untreated it can progress to pneumonia.  If your symptoms do not improve with your treatment plan it is important that you contact your provider.   I have prescribed Doxycycline 100 mg twice a day for 7 days     In addition you may use A prescription cough medication called Tessalon Perles 100mg . You may take 1-2 capsules every 8 hours as needed for your cough. I have also sent in an albuterol inhaler to use as directed for chest tightness and wheezing.  From your responses in the eVisit questionnaire you describe inflammation in the upper respiratory tract which is causing a significant cough.  This is commonly called Bronchitis and has four common causes:   Allergies Viral Infections Acid Reflux Bacterial Infection Allergies, viruses and acid reflux are treated by controlling symptoms or eliminating the cause. An example might be a cough caused by taking certain blood pressure medications. You stop the cough by changing the medication. Another example might be a cough caused by acid reflux. Controlling the reflux helps control the cough.  USE OF BRONCHODILATOR ("RESCUE") INHALERS: There is a risk from using your bronchodilator too frequently.  The risk is that over-reliance on a medication which only relaxes the muscles surrounding the breathing tubes can reduce the effectiveness of medications prescribed to reduce swelling and congestion of the tubes themselves.  Although you feel brief relief from the bronchodilator inhaler, your asthma may actually be worsening with the tubes becoming more swollen and filled with mucus.  This can delay other crucial treatments,  such as oral steroid medications. If you need to use a bronchodilator inhaler daily, several times per day, you should discuss this with your provider.  There are probably better treatments that could be used to keep your asthma under control.     HOME CARE Only take medications as instructed by your medical team. Complete the entire course of an antibiotic. Drink plenty of fluids and get plenty of rest. Avoid close contacts especially the very young and the elderly Cover your mouth if you cough or cough into your sleeve. Always remember to wash your hands A steam or ultrasonic humidifier can help congestion.   GET HELP RIGHT AWAY IF: You develop worsening fever. You become short of breath You cough up blood. Your symptoms persist after you have completed your treatment plan MAKE SURE YOU  Understand these instructions. Will watch your condition. Will get help right away if you are not doing well or get worse.    Thank you for choosing an e-visit.  Your e-visit answers were reviewed by a board certified advanced clinical practitioner to complete your personal care plan. Depending upon the condition, your plan could have included both over the counter or prescription medications.  Please review your pharmacy choice. Make sure the pharmacy is open so you can pick up prescription now. If there is a problem, you may contact your provider through [employer] and have the prescription routed to another pharmacy.  Your safety is important to [place]. If you have drug allergies check your prescription carefully.   For the next 24 hours you  can use MyChart to ask questions about today's visit, request a non-urgent call back, or ask for a work or school excuse. You will get an email in the next two days asking about your experience. I hope that your e-visit has been valuable and will speed your recovery.

## 2023-06-15 NOTE — Progress Notes (Signed)
 I have spent 5 minutes in review of e-visit questionnaire, review and updating patient chart, medical decision making and response to patient.   Piedad Climes, PA-C

## 2023-06-25 ENCOUNTER — Telehealth: Payer: Self-pay | Admitting: Physician Assistant

## 2023-06-25 DIAGNOSIS — B9689 Other specified bacterial agents as the cause of diseases classified elsewhere: Secondary | ICD-10-CM

## 2023-06-25 DIAGNOSIS — J208 Acute bronchitis due to other specified organisms: Secondary | ICD-10-CM

## 2023-06-25 MED ORDER — AZITHROMYCIN 250 MG PO TABS: ORAL_TABLET | ORAL | 0 refills | Status: AC

## 2023-06-25 MED ORDER — PREDNISONE 10 MG (21) PO TBPK
ORAL_TABLET | ORAL | 0 refills | Status: DC
Start: 2023-06-25 — End: 2023-07-02

## 2023-06-25 NOTE — Progress Notes (Signed)
 E-Visit for Cough  We are sorry that you are not feeling well.  Here is how we plan to help!  Based on your presentation I believe you most likely have A cough due to bacteria.  When patients have a fever and a productive cough with a change in color or increased sputum production, we are concerned about bacterial bronchitis.  If left untreated it can progress to pneumonia.  If your symptoms do not improve with your treatment plan it is important that you contact your provider.   I have prescribed Azithromyin 250 mg: two tablets now and then one tablet daily for 4 additonal days    In addition you may use A non-prescription cough medication called Mucinex DM: take 2 tablets every 12 hours.  Prednisone 10 mg daily for 6 days (see taper instructions below)  Directions for 6 day taper: Day 1: 2 tablets before breakfast, 1 after both lunch & dinner and 2 at bedtime Day 2: 1 tab before breakfast, 1 after both lunch & dinner and 2 at bedtime Day 3: 1 tab at each meal & 1 at bedtime Day 4: 1 tab at breakfast, 1 at lunch, 1 at bedtime Day 5: 1 tab at breakfast & 1 tab at bedtime Day 6: 1 tab at breakfast  From your responses in the eVisit questionnaire you describe inflammation in the upper respiratory tract which is causing a significant cough.  This is commonly called Bronchitis and has four common causes:   Allergies Viral Infections Acid Reflux Bacterial Infection Allergies, viruses and acid reflux are treated by controlling symptoms or eliminating the cause. An example might be a cough caused by taking certain blood pressure medications. You stop the cough by changing the medication. Another example might be a cough caused by acid reflux. Controlling the reflux helps control the cough.  USE OF BRONCHODILATOR ("RESCUE") INHALERS: There is a risk from using your bronchodilator too frequently.  The risk is that over-reliance on a medication which only relaxes the muscles surrounding the breathing  tubes can reduce the effectiveness of medications prescribed to reduce swelling and congestion of the tubes themselves.  Although you feel brief relief from the bronchodilator inhaler, your asthma may actually be worsening with the tubes becoming more swollen and filled with mucus.  This can delay other crucial treatments, such as oral steroid medications. If you need to use a bronchodilator inhaler daily, several times per day, you should discuss this with your provider.  There are probably better treatments that could be used to keep your asthma under control.     HOME CARE Only take medications as instructed by your medical team. Complete the entire course of an antibiotic. Drink plenty of fluids and get plenty of rest. Avoid close contacts especially the very young and the elderly Cover your mouth if you cough or cough into your sleeve. Always remember to wash your hands A steam or ultrasonic humidifier can help congestion.   GET HELP RIGHT AWAY IF: You develop worsening fever. You become short of breath You cough up blood. Your symptoms persist after you have completed your treatment plan MAKE SURE YOU  Understand these instructions. Will watch your condition. Will get help right away if you are not doing well or get worse.    Thank you for choosing an e-visit.  Your e-visit answers were reviewed by a board certified advanced clinical practitioner to complete your personal care plan. Depending upon the condition, your plan could have included both over  the counter or prescription medications.  Please review your pharmacy choice. Make sure the pharmacy is open so you can pick up prescription now. If there is a problem, you may contact your provider through [employer] and have the prescription routed to another pharmacy.  Your safety is important to [place]. If you have drug allergies check your prescription carefully.   For the next 24 hours you can use MyChart to ask questions  about today's visit, request a non-urgent call back, or ask for a work or school excuse. You will get an email in the next two days asking about your experience. I hope that your e-visit has been valuable and will speed your recovery.  I have spent 5 minutes in review of e-visit questionnaire, review and updating patient chart, medical decision making and response to patient.   Margaretann Loveless, PA-C

## 2023-07-01 ENCOUNTER — Encounter: Payer: Self-pay | Admitting: Family Medicine

## 2023-07-02 ENCOUNTER — Ambulatory Visit
Admission: RE | Admit: 2023-07-02 | Discharge: 2023-07-02 | Disposition: A | Discharge status: Home / Self Care | Source: Ambulatory Visit | Attending: Family Medicine | Admitting: Family Medicine

## 2023-07-02 ENCOUNTER — Ambulatory Visit
Admission: RE | Admit: 2023-07-02 | Discharge: 2023-07-02 | Disposition: A | Discharge status: Home / Self Care | Attending: Family Medicine | Admitting: Family Medicine

## 2023-07-02 ENCOUNTER — Encounter: Payer: Self-pay | Admitting: Family Medicine

## 2023-07-02 ENCOUNTER — Ambulatory Visit: Admitting: Family Medicine

## 2023-07-02 VITALS — BP 149/88 | HR 71 | Ht 74.0 in | Wt 268.0 lb

## 2023-07-02 DIAGNOSIS — R0989 Other specified symptoms and signs involving the circulatory and respiratory systems: Secondary | ICD-10-CM | POA: Diagnosis present

## 2023-07-02 DIAGNOSIS — R052 Subacute cough: Secondary | ICD-10-CM | POA: Insufficient documentation

## 2023-07-02 NOTE — Progress Notes (Signed)
 Acute visit   Patient: Shaun Griffith   DOB: 1960/04/18   63 y.o. Male  MRN: [id number] PCP: Carlean Charter, DO   Chief Complaint  Patient presents with   Bronchitis    He was told he had Bronchitis, her has been seen and treated, and was told to come into clinic of he still wasn't feeling better, he stated nothing has helped he still feel bad   Subjective    Discussed the use of AI scribe software for clinical note transcription with the patient, who gave verbal consent to proceed.  History of Present Illness   The patient, with a three-week history of bronchitis, presents with persistent symptoms despite treatment with doxycycline  and azithromycin . Initially, the patient experienced significant coughing with phlegm production and a sensation of fullness in the chest. The patient also reports head congestion and mild fever. Recently, the patient has been experiencing night sweats, which may be attributed to the use of prednisone . The cough has improved, but the patient still feels unwell and reports a sensation of residual congestion in the lungs. The patient has been using over-the-counter Mucinex and Flonase  to manage symptoms.       Review of Systems  Objective    BP (!) 149/88   Pulse 71   Ht 6\' 2"  (1.88 m)   Wt 268 lb (121.6 kg)   SpO2 98%   BMI 34.41 kg/m  Physical Exam Vitals reviewed.  Constitutional:      General: He is not in acute distress.    Appearance: Normal appearance. He is not diaphoretic.  HENT:     Head: Normocephalic and atraumatic.     Right Ear: Tympanic membrane, ear canal and external ear normal.     Left Ear: Tympanic membrane, ear canal and external ear normal.     Nose: Nose normal.     Mouth/Throat:     Mouth: Mucous membranes are moist.     Pharynx: Oropharynx is clear.  Eyes:     General: No scleral icterus.    Conjunctiva/sclera: Conjunctivae normal.     Pupils: Pupils are equal, round, and reactive to light.  Cardiovascular:      Rate and Rhythm: Normal rate and regular rhythm.     Heart sounds: Normal heart sounds. No murmur heard. Pulmonary:     Effort: Pulmonary effort is normal. No respiratory distress.     Breath sounds: Rales (R>L) present. No wheezing or rhonchi.  Musculoskeletal:     Cervical back: Neck supple.     Right lower leg: No edema.     Left lower leg: No edema.  Lymphadenopathy:     Cervical: No cervical adenopathy.  Skin:    General: Skin is warm and dry.  Neurological:     Mental Status: He is alert. Mental status is at baseline.       No results found for any visits on 07/02/23.  Assessment & Plan     Problem List Items Addressed This Visit   None Visit Diagnoses       Abnormal lung sounds    -  Primary   Relevant Orders   DG Chest 2 View     Subacute cough       Relevant Orders   DG Chest 2 View           Possible Pneumonia Persistent cough, chest congestion, and fatigue for three weeks. Right lung auscultation reveals crackles, suggesting possible pneumonia. Previous treatments with  doxycycline  and azithromycin  may have addressed bacterial causes, but viral pneumonia remains a possibility. Differential includes viral pneumonia such as RSV or COVID-19. Chest x-ray needed to confirm diagnosis and guide further treatment. Avoiding additional antibiotics until x-ray results are available to prevent unnecessary side effects. - Order chest x-ray at the outpatient imaging center to evaluate for pneumonia. - Continue guaifenesin to thin mucus and alleviate chest congestion.  Bronchitis Symptoms have persisted for three weeks despite treatment with doxycycline  and azithromycin . Cough has improved, but chest congestion and fatigue remain. Night sweats noted, possibly related to prednisone  use. Current symptoms may overlap with possible pneumonia. - Hold off on further antibiotics until chest x-ray results are available to avoid unnecessary side effects.       No orders of  the defined types were placed in this encounter.    Return if symptoms worsen or fail to improve.      Aden Agreste, MD  [facility] [phone] (phone) [phone] (fax)  [facility]

## 2023-07-05 ENCOUNTER — Encounter: Payer: Self-pay | Admitting: Family Medicine

## 2023-07-05 ENCOUNTER — Other Ambulatory Visit: Payer: Self-pay | Admitting: Family Medicine

## 2023-07-05 ENCOUNTER — Telehealth: Payer: Self-pay

## 2023-07-05 DIAGNOSIS — F4001 Agoraphobia with panic disorder: Secondary | ICD-10-CM

## 2023-07-05 NOTE — Telephone Encounter (Signed)
 Copied from CRM [id number]. Topic: Clinical - Lab/Test Results >> Jul 05, 2023  3:36 PM Phil Braun wrote: Reason for CRM:  Ref xray results  Pt had called and was wanting to know xray results and also he is not feeling any better. Please advise.

## 2023-07-06 ENCOUNTER — Encounter: Payer: Self-pay | Admitting: Family Medicine

## 2023-07-07 ENCOUNTER — Encounter: Payer: Self-pay | Admitting: Family Medicine

## 2023-07-07 ENCOUNTER — Telehealth: Payer: Self-pay

## 2023-07-07 DIAGNOSIS — R5381 Other malaise: Secondary | ICD-10-CM

## 2023-07-07 DIAGNOSIS — R052 Subacute cough: Secondary | ICD-10-CM

## 2023-07-07 NOTE — Telephone Encounter (Signed)
 The sister of the patient called stating she was told by imaging the the chest x ray results were sent to the provider. She said he is still not doing well so he is very concerned about what the images show. Please assist patient as soon as possible. The sister states she is in [place]  and she is flying at 8:40 and will not be back until 12:30 or 1. If it is during that time please contact the patient himself.

## 2023-07-07 NOTE — Telephone Encounter (Signed)
 Copied from CRM [id number]. Topic: Clinical - Lab/Test Results >> Jul 07, 2023  9:56 AM Sophia H wrote: Reason for CRM: Pt sister called in regarding xray results, stated pt has been really concerned since he hasn't heard back yet and is still feeling bad. Wants a callback ASAP.  Mabelene Savannah - [phone]

## 2023-07-07 NOTE — Telephone Encounter (Signed)
 Duplicate encounter [place] imaging reading room contacted to see if imaging can be read.

## 2023-07-08 ENCOUNTER — Other Ambulatory Visit (HOSPITAL_COMMUNITY): Payer: Self-pay

## 2023-07-08 ENCOUNTER — Telehealth: Payer: Self-pay

## 2023-07-08 NOTE — Telephone Encounter (Signed)
 Pharmacy Patient Advocate Encounter   Received notification from [employer]base that prior authorization for Azelastine -Fluticasone  137-50MCG/ACT suspension is required/requested.   Insurance verification completed.   The patient is insured through [facility] .   Per test claim: PA required; PA submitted to above mentioned insurance via CoverMyMeds Key/confirmation #/EOC [id number] Status is pending

## 2023-07-09 ENCOUNTER — Other Ambulatory Visit (HOSPITAL_COMMUNITY): Payer: Self-pay

## 2023-07-09 NOTE — Telephone Encounter (Signed)
 Pharmacy Patient Advocate Encounter  Received notification from [facility] that Prior Authorization for Azelastine -Fluticasone  137-50MCG/ACT suspension has been APPROVED from 05/03/23 to 05/02/24. Unable to obtain price due to refill too soon rejection, last fill date 07/07/23 next available fill date05/23/25   PA #/Case ID/Reference #: [id number]

## 2023-07-19 ENCOUNTER — Other Ambulatory Visit: Payer: Self-pay | Admitting: Family Medicine

## 2023-07-19 DIAGNOSIS — G479 Sleep disorder, unspecified: Secondary | ICD-10-CM

## 2023-07-20 ENCOUNTER — Other Ambulatory Visit: Payer: Self-pay

## 2023-07-20 MED ORDER — TRAZODONE HCL 100 MG PO TABS
100.0000 mg | ORAL_TABLET | Freq: Every day | ORAL | 0 refills | Status: DC
  Filled 2023-07-20: qty 90, 90d supply, fill #0

## 2023-07-23 ENCOUNTER — Other Ambulatory Visit: Payer: Self-pay

## 2023-07-30 ENCOUNTER — Encounter: Payer: BLUE CROSS/BLUE SHIELD | Admitting: Family Medicine

## 2023-08-05 ENCOUNTER — Other Ambulatory Visit: Payer: Self-pay | Admitting: Family Medicine

## 2023-08-05 ENCOUNTER — Ambulatory Visit: Admitting: Pulmonary Disease

## 2023-08-05 DIAGNOSIS — F332 Major depressive disorder, recurrent severe without psychotic features: Secondary | ICD-10-CM

## 2023-09-06 ENCOUNTER — Encounter: Payer: Self-pay | Admitting: Family Medicine

## 2023-09-06 ENCOUNTER — Ambulatory Visit (INDEPENDENT_AMBULATORY_CARE_PROVIDER_SITE_OTHER): Payer: Self-pay | Admitting: Family Medicine

## 2023-09-06 VITALS — BP 134/80 | HR 72 | Resp 16 | Ht 74.0 in | Wt 269.3 lb

## 2023-09-06 DIAGNOSIS — J301 Allergic rhinitis due to pollen: Secondary | ICD-10-CM

## 2023-09-06 DIAGNOSIS — Z0000 Encounter for general adult medical examination without abnormal findings: Secondary | ICD-10-CM

## 2023-09-06 DIAGNOSIS — R052 Subacute cough: Secondary | ICD-10-CM

## 2023-09-06 DIAGNOSIS — Z0001 Encounter for general adult medical examination with abnormal findings: Secondary | ICD-10-CM

## 2023-09-06 DIAGNOSIS — I10 Essential (primary) hypertension: Secondary | ICD-10-CM

## 2023-09-06 MED ORDER — FEXOFENADINE HCL 180 MG PO TABS: 180.0000 mg | ORAL_TABLET | Freq: Every day | ORAL | 3 refills | Status: AC

## 2023-09-06 MED ORDER — HYDROCHLOROTHIAZIDE 12.5 MG PO CAPS: ORAL_CAPSULE | ORAL | 0 refills | Status: DC

## 2023-09-06 NOTE — Patient Instructions (Signed)
 Stop the metoprolol  and start hydrochlorothiazide .  - Take 1 tablet of hydrochlorothiazide  daily.  If after 2 weeks your blood pressure is still higher than 130/80, start taking 2 tablets of hydrochlorothiazide  daily.   _____________________________________________  Check your blood pressure once daily, and any time you have concerning symptoms like headache, chest pain, dizziness, shortness of breath, or vision changes.   Our goal is less than 130/80.  To appropriately check your blood pressure, make sure you do the following:  1) Avoid caffeine, exercise, or tobacco products for 30 minutes before checking. Empty your bladder. 2) Sit with your back supported in a flat-backed chair. Rest your arm on something flat (arm of the chair, table, etc). 3) Sit still with your feet flat on the floor, resting, for at least 5 minutes.  4) Check your blood pressure. Take 1-2 readings.  5) Write down these readings and bring with you to any provider appointments.  Bring your home blood pressure machine with you to a provider's office for accuracy comparison at least once a year.   Make sure you take your blood pressure medications before you come to any office visit, even if you were asked to fast for labs.

## 2023-09-06 NOTE — Progress Notes (Addendum)
 Complete physical exam   Patient: Shaun Griffith   DOB: Jan 13, 1961   63 y.o. Male  MRN: [id number] Visit Date: 09/06/2023  Today's healthcare provider: LAURAINE LOISE BUOY, DO   Chief Complaint  Patient presents with   Annual Exam    Sleeping pattern: Not sleeping good at night Exercising: No  Discuss getting allergy shots, but unable to get due to taking beta blockers Decline shingrix vaccine   Subjective    Shaun Griffith is a 63 y.o. male who presents today for a complete physical exam.  He reports consuming a general diet. The patient does not participate in regular exercise at present. He generally feels fairly well. He reports sleeping poorly. He does have additional problems to discuss today.   HPI HPI     Annual Exam    Additional comments: Sleeping pattern: Not sleeping good at night Exercising: No  Discuss getting allergy shots, but unable to get due to taking beta blockers Decline shingrix vaccine      Last edited by Wilfred Hargis RAMAN, CMA on 09/06/2023 11:06 AM.      [name] is a 63 year old male with hypertension who presents with concerns about postnasal drip and medication management.  He experiences postnasal drip originating from his nasal area and moving downward, accompanied by a tingling sensation in the back of his throat and a persistent feeling of having the flu. He has not been taking any allergy medications recently, although azelastine  spray and Xyzal  are available. He has not tried fexofenadine (Allegra). A previous evaluation by a pulmonologist identified nasal polyps, but a subsequent examination by an ENT did not confirm this finding.  He has a chronic cough and uses Symbicort, taking two puffs twice a day, soon to be reduced to one puff twice a day. He also uses albuterol  as a rescue inhaler. No recent chest discomfort, shortness of breath, or ear pain. He uses a CPAP machine.  He has hypertension managed with metoprolol  and  losartan . He was on lisinopril  previously but was switched due to a cough. No history of heart attack or rapid heart rate, and a previous cardiology evaluation nine years ago for chest pain showed no abnormalities.  He has a history of back surgery and reports muscle pain in his back, particularly when sitting up. He experiences tingling in his toes, which has been ongoing for some time. He occasionally feels dizzy, particularly when changing positions, such as getting out of bed in the morning.     Past Medical History:  Diagnosis Date   Allergy    Anemia    Anxiety    Arthritis    Depression    GERD (gastroesophageal reflux disease)    Hypertension    IBS (irritable bowel syndrome)    Sleep apnea    Past Surgical History:  Procedure Laterality Date   BACK SURGERY  1990   HNP L5   COLONOSCOPY     COLONOSCOPY WITH PROPOFOL  N/A 05/13/2017   Procedure: COLONOSCOPY WITH PROPOFOL ;  Surgeon: Therisa Bi, MD;  Location: [facility] ENDOSCOPY;  Service: Gastroenterology;  Laterality: N/A;   COLONOSCOPY WITH PROPOFOL  N/A 02/17/2022   Procedure: COLONOSCOPY WITH PROPOFOL ;  Surgeon: Therisa Bi, MD;  Location: [facility] ENDOSCOPY;  Service: Gastroenterology;  Laterality: N/A;   ESOPHAGOGASTRODUODENOSCOPY (EGD) WITH PROPOFOL  N/A 12/24/2021   Procedure: ESOPHAGOGASTRODUODENOSCOPY (EGD) WITH PROPOFOL ;  Surgeon: Therisa Bi, MD;  Location: [facility] ENDOSCOPY;  Service: Gastroenterology;  Laterality: N/A;   HERNIA REPAIR  1984   right groin   KNEE SURGERY Left 1992   Social History   Socioeconomic History   Marital status: Divorced    Spouse name: Not on file   Number of children: Not on file   Years of education: Not on file   Highest education level: GED or equivalent  Occupational History   Not on file  Tobacco Use   Smoking status: Former    Types: Cigarettes   Smokeless tobacco: Never   Tobacco comments:    Social smoker with concurrent use of alcohol   Vaping Use   Vaping status: Never Used   Substance and Sexual Activity   Alcohol use: Yes    Alcohol/week: 18.0 standard drinks of alcohol    Types: 18 Cans of beer per week    Comment: Moderate alcohol use, drinks beer 3-4 days a week; drinks 8-10 beers.NO ETOH IN 1 WEEK   Drug use: No   Sexual activity: Not Currently  Other Topics Concern   Not on file  Social History Narrative   Not on file   Social Drivers of Health   Financial Resource Strain: Low Risk  (08/30/2023)   Overall Financial Resource Strain (CARDIA)    Difficulty of Paying Living Expenses: Not hard at all  Food Insecurity: No Food Insecurity (08/30/2023)   Hunger Vital Sign    Worried About Running Out of Food in the Last Year: Never true    Ran Out of Food in the Last Year: Never true  Transportation Needs: No Transportation Needs (08/30/2023)   PRAPARE - Administrator, Civil Service (Medical): No    Lack of Transportation (Non-Medical): No  Physical Activity: Inactive (08/30/2023)   Exercise Vital Sign    Days of Exercise per Week: 0 days    Minutes of Exercise per Session: Not on file  Stress: No Stress Concern Present (08/30/2023)   [employer] of Occupational Health - Occupational Stress Questionnaire    Feeling of Stress: Only a little  Social Connections: Moderately Isolated (08/30/2023)   Social Connection and Isolation Panel    Frequency of Communication with Friends and Family: Three times a week    Frequency of Social Gatherings with Friends and Family: Twice a week    Attends Religious Services: 1 to 4 times per year    Active Member of [employer] or Organizations: No    Attends Engineer, structural: Not on file    Marital Status: Divorced  Catering manager Violence: Not on file   Family Status  Relation Name Status   Mother  Deceased at age 33       Died from a stroke   Father  Deceased at age 50       Had kidney failure   MGM  Deceased   PGM  Deceased   PGF  Deceased  No partnership data on file   Family  History  Adopted: Yes  Problem Relation Age of Onset   Diabetes Mother    Depression Mother    Cancer Father        skin and blood   Allergies  Allergen Reactions   Sulfadimethoxine Nausea Only    flu like symptoms    Patient Care Team: Donzella Lauraine SAILOR, DO as PCP - General (Family Medicine)   Medications: Outpatient Medications Prior to Visit  Medication Sig   Azelastine -Fluticasone  137-50 MCG/ACT SUSP Place 1 spray into the nose every 12 (twelve) hours.   Bacillus Coagulans-Inulin (ALIGN PREBIOTIC-PROBIOTIC  PO) Take 1 capsule by mouth daily.   budesonide-formoterol (SYMBICORT) 80-4.5 MCG/ACT inhaler Inhale 2 puffs into the lungs 2 (two) times daily.   Cholecalciferol (VITAMIN D3) 50 MCG (2000 UT) capsule Take 2,000 Units by mouth daily.   Docusate Calcium  (STOOL SOFTENER PO) Take by mouth as needed.   escitalopram  (LEXAPRO ) 20 MG tablet TAKE 1 TABLET BY MOUTH ONCE DAILY   gabapentin  (NEURONTIN ) 800 MG tablet Take 1 tablet (800 mg total) by mouth 3 (three) times daily.   LORazepam  (ATIVAN ) 0.5 MG tablet TAKE 1 TABLET BY MOUTH EVERY DAY AS NEEDED   losartan  (COZAAR ) 100 MG tablet Take 1 tablet (100 mg total) by mouth daily.   metoprolol  succinate (TOPROL -XL) 50 MG 24 hr tablet Take 1 tablet (50 mg total) by mouth daily. Take with or immediately following a meal.   Multiple Vitamin (MULTIVITAMIN) capsule Take 1 capsule by mouth daily.   omeprazole  (PRILOSEC) 40 MG capsule TAKE 1 CAPSULE BY MOUTH IN THE MORNING AND AT BEDTIME   rosuvastatin  (CRESTOR ) 10 MG tablet Take 1 tablet (10 mg total) by mouth daily.   traZODone  (DESYREL ) 100 MG tablet Take 1 tablet (100 mg total) by mouth at bedtime.   [DISCONTINUED] levocetirizine (XYZAL ) 5 MG tablet Take 1 tablet (5 mg total) by mouth every evening.   albuterol  (VENTOLIN  HFA) 108 (90 Base) MCG/ACT inhaler Inhale 2 puffs into the lungs every 6 (six) hours as needed for wheezing or shortness of breath.   [DISCONTINUED] benzonatate  (TESSALON )  100 MG capsule Take 1 capsule (100 mg total) by mouth 3 (three) times daily as needed for cough.   No facility-administered medications prior to visit.    Review of Systems  Constitutional:  Negative for appetite change, chills, fatigue and fever.  HENT:  Positive for postnasal drip and rhinorrhea. Negative for congestion, ear pain, hearing loss, nosebleeds and trouble swallowing.   Eyes:  Negative for pain and visual disturbance.  Respiratory:  Positive for cough. Negative for chest tightness and shortness of breath.   Cardiovascular:  Negative for chest pain, palpitations and leg swelling.  Gastrointestinal:  Negative for abdominal pain, blood in stool, constipation, diarrhea, nausea and vomiting.  Endocrine: Negative for polydipsia, polyphagia and polyuria.  Genitourinary:  Negative for dysuria and flank pain.  Musculoskeletal:  Positive for arthralgias and back pain (low). Negative for joint swelling, myalgias and neck stiffness.  Skin:  Negative for color change, rash and wound.  Neurological:  Positive for dizziness (intermittent) and numbness (Toes bilaterally). Negative for tremors, seizures, speech difficulty, weakness, light-headedness and headaches.  Psychiatric/Behavioral:  Negative for behavioral problems, confusion, decreased concentration, dysphoric mood and sleep disturbance. The patient is not nervous/anxious.   All other systems reviewed and are negative.     Objective    BP 134/80 (BP Location: Right Arm, Patient Position: Sitting, Cuff Size: Large)   Pulse 72   Resp 16   Ht 6' 2 (1.88 m)   Wt 269 lb 4.8 oz (122.2 kg)   SpO2 97%   BMI 34.58 kg/m    Physical Exam Vitals and nursing note reviewed.  Constitutional:      General: He is awake.     Appearance: Normal appearance.  HENT:     Head: Normocephalic and atraumatic.     Right Ear: Tympanic membrane, ear canal and external ear normal.     Left Ear: Tympanic membrane, ear canal and external ear normal.      Nose: Nose normal.     Right  Turbinates: Enlarged and swollen. Not pale.     Left Turbinates: Enlarged and swollen. Not pale.     Mouth/Throat:     Mouth: Mucous membranes are moist.     Pharynx: Oropharynx is clear. No oropharyngeal exudate or posterior oropharyngeal erythema.   Eyes:     General: No scleral icterus.    Extraocular Movements: Extraocular movements intact.     Conjunctiva/sclera: Conjunctivae normal.     Pupils: Pupils are equal, round, and reactive to light.   Neck:     Thyroid: No thyromegaly or thyroid tenderness.   Cardiovascular:     Rate and Rhythm: Normal rate and regular rhythm.     Pulses: Normal pulses.     Heart sounds: Normal heart sounds.  Pulmonary:     Effort: Pulmonary effort is normal. No tachypnea, bradypnea or respiratory distress.     Breath sounds: Normal breath sounds. No stridor. No wheezing, rhonchi or rales.  Abdominal:     General: Bowel sounds are normal. There is no distension.     Palpations: Abdomen is soft. There is no mass.     Tenderness: There is no abdominal tenderness. There is no guarding.     Hernia: No hernia is present.   Musculoskeletal:     Cervical back: Normal range of motion and neck supple.     Right lower leg: No edema.     Left lower leg: No edema.  Lymphadenopathy:     Cervical: No cervical adenopathy.   Skin:    General: Skin is warm and dry.   Neurological:     Mental Status: He is alert and oriented to person, place, and time. Mental status is at baseline.   Psychiatric:        Mood and Affect: Mood normal.        Behavior: Behavior normal.       Last depression screening scores    09/06/2023   11:05 AM 07/02/2023   10:39 AM 05/05/2023    2:22 PM  PHQ 2/9 Scores  PHQ - 2 Score 4 2 2   PHQ- 9 Score 11 11 9    Last fall risk screening    09/06/2023   11:05 AM  Fall Risk   Falls in the past year? 0  Number falls in past yr: 0  Injury with Fall? 0  Risk for fall due to : No Fall Risks    Last Audit-C alcohol use screening    08/30/2023    9:12 AM  Alcohol Use Disorder Test (AUDIT)  1. How often do you have a drink containing alcohol? 2  2. How many drinks containing alcohol do you have on a typical day when you are drinking? 1  3. How often do you have six or more drinks on one occasion? 0  AUDIT-C Score 3   4. How often during the last year have you found that you were not able to stop drinking once you had started? 0  5. How often during the last year have you failed to do what was normally expected from you because of drinking? 0  6. How often during the last year have you needed a first drink in the morning to get yourself going after a heavy drinking session? 0  7. How often during the last year have you had a feeling of guilt of remorse after drinking? 0  8. How often during the last year have you been unable to remember what happened the night before because  you had been drinking? 0  9. Have you or someone else been injured as a result of your drinking? 0  10. Has a relative or friend or a doctor or another health worker been concerned about your drinking or suggested you cut down? 0  Alcohol Use Disorder Identification Test Final Score (AUDIT) 3      Patient-reported   A score of 3 or more in women, and 4 or more in men indicates increased risk for alcohol abuse, EXCEPT if all of the points are from question 1   No results found for any visits on 09/06/23.  Assessment & Plan    Routine Health Maintenance and Physical Exam  Exercise Activities and Dietary recommendations  Goals   None     Immunization History  Administered Date(s) Administered   Influenza,inj,Quad PF,6+ Mos 12/31/2015, 01/20/2018   Influenza-Unspecified 01/01/2015   Janssen (J&J) SARS-COV-2 Vaccination 10/31/2019   Moderna Sars-Covid-2 Vaccination 03/05/2020   Tdap 12/31/2015    Health Maintenance  Topic Date Due   COVID-19 Vaccine (3 - 2024-25 season) 11/15/2023 (Originally  11/08/2022)   Zoster Vaccines- Shingrix (1 of 2) 12/07/2023 (Originally 10/24/2009)   INFLUENZA VACCINE  10/08/2023   DTaP/Tdap/Td (2 - Td or Tdap) 12/30/2025   Colonoscopy  02/18/2032   Hepatitis C Screening  Completed   HIV Screening  Completed   Hepatitis B Vaccines  Aged Out   HPV VACCINES  Aged Out   Meningococcal B Vaccine  Aged Out    Discussed health benefits of physical activity, and encouraged him to engage in regular exercise appropriate for his age and condition.   Annual physical exam  Non-seasonal allergic rhinitis due to pollen -     Fexofenadine HCl; Take 1 tablet (180 mg total) by mouth daily.  Dispense: 90 tablet; Refill: 3  Primary hypertension -     hydroCHLOROthiazide ; Take 1 capsule (12.5 mg total) by mouth daily for 14 days, THEN 2 capsules (25 mg total) daily for 16 days. Increase only if BP remains >130/80.  Dispense: 46 capsule; Refill: 0  Subacute cough      Annual physical exam Physical exam overall unremarkable except as noted above. Routine lab work up to date. Has not received shingles vaccine due to congestion issues. Discussed potential for future vaccination once symptoms resolve. - Consider shingles vaccination once congestion resolves.  Nonallergic seasonal rhinitis with postnasal drip Chronic postnasal drip likely contributing to upper respiratory symptoms. ENT evaluation negative for nasal polyps. Considering discontinuation of metoprolol  to allow for allergy shots. - Discontinue metoprolol  to allow for potential allergy shots. - Prescribe fexofenadine (Allegra) to manage allergy symptoms. - Follow up with ENT regarding allergy shots and duration off beta blockers.  Subacute cough Subacute cough, ongoing since April 2025, managed with Symbicort prescribed by pulmonology. No COPD diagnosis. Persistent symptoms despite treatment. - Continue Symbicort as prescribed.  Hypertension Hypertension managed with losartan . Blood pressure management  adjusted due to discontinuation of metoprolol . Initiating hydrochlorothiazide  with home monitoring to assess efficacy. - Discontinue metoprolol . - Initiate hydrochlorothiazide  1 capsule daily for 2 weeks. - Monitor blood pressure at home. If blood pressure remains above 130/80 mmHg after 2 weeks, increase hydrochlorothiazide  to 2 capsules daily. - Schedule follow-up in 3-4 weeks to assess blood pressure control.  Back Pain Chronic back pain with previous surgery. Experiences muscle-related discomfort and tingling in toes. Insurance limits access to physical therapy. - Provide a printout of exercises to manage back pain at home.     Return in  about 23 days (around 09/29/2023) for HTN.     I discussed the assessment and treatment plan with the patient  The patient was provided an opportunity to ask questions and all were answered. The patient agreed with the plan and demonstrated an understanding of the instructions.   The patient was advised to call back or seek an in-person evaluation if the symptoms worsen or if the condition fails to improve as anticipated.    LAURAINE LOISE BUOY, DO  [facility] [phone] (phone) [phone] (fax)  [facility]

## 2023-09-28 ENCOUNTER — Telehealth: Payer: Self-pay

## 2023-09-28 NOTE — Telephone Encounter (Signed)
 Called no answer, unable to leave messages mailbox is full. Tried to reschedule for afternoon as a virtual appt.

## 2023-09-29 ENCOUNTER — Ambulatory Visit: Admitting: Family Medicine

## 2023-10-21 ENCOUNTER — Other Ambulatory Visit: Payer: Self-pay | Admitting: Family Medicine

## 2023-10-21 DIAGNOSIS — I10 Essential (primary) hypertension: Secondary | ICD-10-CM

## 2023-10-22 NOTE — Telephone Encounter (Unsigned)
 Copied from CRM [id number]. Topic: Clinical - Medication Refill >> Oct 22, 2023  3:44 PM Lauren C wrote: Medication: hydrochlorothiazide  (MICROZIDE ) 12.5 MG capsule    Has the patient contacted their pharmacy? Yes (Agent: If no, request that the patient contact the pharmacy for the refill. If patient does not wish to contact the pharmacy document the reason why and proceed with request.) (Agent: If yes, when and what did the pharmacy advise?)  Yes, we had not responded to the request.  This is the patient's preferred pharmacy:  [facility] - [place], [place] - [address] [place] [address] Phone: [phone] Fax: [phone]  Is this the correct pharmacy for this prescription? Yes If no, delete pharmacy and type the correct one.   Has the prescription been filled recently? Yes  Is the patient out of the medication? Yes  Has the patient been seen for an appointment in the last year OR does the patient have an upcoming appointment? Yes  Can we respond through MyChart? Yes  Agent: Please be advised that Rx refills may take up to 3 business days. We ask that you follow-up with your pharmacy.

## 2023-10-25 NOTE — Telephone Encounter (Signed)
 Requested medications are due for refill today.  unsure  Requested medications are on the active medications list.  yes  Last refill. 09/06/2023 #[phone] [id number]  Future visit scheduled.   yes  Notes to clinic.  Rx expired 10/06/2023.    Requested Prescriptions  Pending Prescriptions Disp Refills   hydrochlorothiazide  (MICROZIDE ) 12.5 MG capsule [Pharmacy Med Name: HYDROCHLOROTHIAZIDE  12.5 MG CAP] 46 capsule 0    Sig: TAKE 1 CAPSULE (12.5MG ) BY MOUTH FOR 14 DAYS, THEN 2 CAPSULES (25MG ) FOR 16 DAYS.  INCREASE ONLY IF BP REMAINS GREATER THAN 130/80     Cardiovascular: Diuretics - Thiazide Failed - 10/25/2023  2:08 PM      Failed - Cr in normal range and within 180 days    Creat  Date Value Ref Range Status  12/31/2016 0.73 0.70 - 1.33 mg/dL Final    Comment:    For patients >75 years of age, the reference limit for Creatinine is approximately 13% higher for people identified as African-American. .    Creatinine, Ser  Date Value Ref Range Status  12/29/2022 0.98 0.76 - 1.27 mg/dL Final         Failed - K in normal range and within 180 days    Potassium  Date Value Ref Range Status  12/29/2022 4.2 3.5 - 5.2 mmol/L Final         Failed - Na in normal range and within 180 days    Sodium  Date Value Ref Range Status  12/29/2022 141 134 - 144 mmol/L Final         Passed - Last BP in normal range    BP Readings from Last 1 Encounters:  09/06/23 134/80         Passed - Valid encounter within last 6 months    Recent Outpatient Visits           1 month ago Annual physical exam   [facility] [place], Lauraine SAILOR, DO   3 months ago Abnormal lung sounds   [facility] [place], Jon HERO, MD   5 months ago Primary hypertension   [facility] [place], Lauraine SAILOR, [place]

## 2023-10-25 NOTE — Telephone Encounter (Signed)
 The patient called checking on the status of his refill request from Thursday 10/21/23. I spoke with Niels and she said to tell him someone will call him today with the status. I relayed the message to the patient.

## 2023-11-01 ENCOUNTER — Telehealth: Admitting: Family Medicine

## 2023-11-01 ENCOUNTER — Encounter: Payer: Self-pay | Admitting: Family Medicine

## 2023-11-01 DIAGNOSIS — I10 Essential (primary) hypertension: Secondary | ICD-10-CM

## 2023-11-01 DIAGNOSIS — M5442 Lumbago with sciatica, left side: Secondary | ICD-10-CM

## 2023-11-01 DIAGNOSIS — M5441 Lumbago with sciatica, right side: Secondary | ICD-10-CM | POA: Diagnosis not present

## 2023-11-01 MED ORDER — NAPROXEN 500 MG PO TABS: 500.0000 mg | ORAL_TABLET | Freq: Two times a day (BID) | ORAL | 0 refills | Status: AC

## 2023-11-01 MED ORDER — HYDROCHLOROTHIAZIDE 12.5 MG PO CAPS: 12.5000 mg | ORAL_CAPSULE | Freq: Every day | ORAL | 3 refills | Status: AC

## 2023-11-01 MED ORDER — CYCLOBENZAPRINE HCL 5 MG PO TABS
5.0000 mg | ORAL_TABLET | Freq: Every evening | ORAL | 0 refills | Status: DC | PRN
Start: 2023-11-01 — End: 2023-12-08

## 2023-11-01 NOTE — Progress Notes (Signed)
 MyChart Video Visit    Virtual Visit via Video Note   This format is felt to be most appropriate for this patient at this time. Physical exam was limited by quality of the video and audio technology used for the visit.   Patient location: Home Provider location: [facility]  I discussed the limitations of evaluation and management by telemedicine and the availability of in person appointments. The patient expressed understanding and agreed to proceed.  Patient: Shaun Griffith   DOB: [birth year]   63 y.o. Male  MRN: [id number] Visit Date: 11/01/2023  Today's healthcare provider: LAURAINE LOISE BUOY, DO   No chief complaint on file.  Subjective    HPI  Shaun Griffith is a 63 year old male with hypertension who presents for blood pressure management and musculoskeletal pain.  He monitors his blood pressure at home, with readings of 123/73 mmHg in the morning and 118/69 mmHg in the afternoon. He is currently taking losartan  100 mg daily and hydrochlorothiazide  12.5 mg daily. No dizziness, lightheadedness, chest pain, shortness of breath, vision changes, or headaches.  He experiences back pain and knee pain. He reports that he may have slept wrong and has an old knee injury, and that his knee pain worsened after walking. He takes gabapentin  once daily at night, although it is prescribed three times a day, as he does not feel significant nerve pain in his right leg. He occasionally uses naproxen  for pain relief. The back pain is in the center or lower back, with some sciatica-like symptoms affecting both sides. The pain slightly affects his sleep.  He mentions undergoing an allergy test which revealed allergies to trees and grasses.     Medications: Outpatient Medications Prior to Visit  Medication Sig   Azelastine -Fluticasone  137-50 MCG/ACT SUSP Place 1 spray into the nose every 12 (twelve) hours.   Bacillus Coagulans-Inulin (ALIGN PREBIOTIC-PROBIOTIC PO) Take  1 capsule by mouth daily.   budesonide-formoterol (SYMBICORT) 80-4.5 MCG/ACT inhaler Inhale 2 puffs into the lungs 2 (two) times daily.   Cholecalciferol (VITAMIN D3) 50 MCG (2000 UT) capsule Take 2,000 Units by mouth daily.   Docusate Calcium  (STOOL SOFTENER PO) Take by mouth as needed.   escitalopram  (LEXAPRO ) 20 MG tablet TAKE 1 TABLET BY MOUTH ONCE DAILY   fexofenadine  (ALLEGRA ) 180 MG tablet Take 1 tablet (180 mg total) by mouth daily.   gabapentin  (NEURONTIN ) 800 MG tablet Take 1 tablet (800 mg total) by mouth 3 (three) times daily.   LORazepam  (ATIVAN ) 0.5 MG tablet TAKE 1 TABLET BY MOUTH EVERY DAY AS NEEDED   losartan  (COZAAR ) 100 MG tablet Take 1 tablet (100 mg total) by mouth daily.   Multiple Vitamin (MULTIVITAMIN) capsule Take 1 capsule by mouth daily.   omeprazole  (PRILOSEC) 40 MG capsule TAKE 1 CAPSULE BY MOUTH IN THE MORNING AND AT BEDTIME   rosuvastatin  (CRESTOR ) 10 MG tablet Take 1 tablet (10 mg total) by mouth daily.   traZODone  (DESYREL ) 100 MG tablet Take 1 tablet (100 mg total) by mouth at bedtime.   [DISCONTINUED] albuterol  (VENTOLIN  HFA) 108 (90 Base) MCG/ACT inhaler Inhale 2 puffs into the lungs every 6 (six) hours as needed for wheezing or shortness of breath.   [DISCONTINUED] hydrochlorothiazide  (MICROZIDE ) 12.5 MG capsule Take 1 capsule (12.5 mg total) by mouth daily.   [DISCONTINUED] metoprolol  succinate (TOPROL -XL) 50 MG 24 hr tablet Take 1 tablet (50 mg total) by mouth daily. Take with or immediately following a meal.   No  facility-administered medications prior to visit.    Review of Systems  Eyes:  Negative for visual disturbance.  Respiratory: Negative.  Negative for cough, shortness of breath and wheezing.   Cardiovascular:  Negative for chest pain, palpitations and leg swelling.  Musculoskeletal:  Positive for arthralgias (knee) and back pain.  Neurological:  Negative for weakness and headaches.        Objective    There were no vitals taken for  this visit.     Physical Exam Constitutional:      General: He is not in acute distress.    Appearance: Normal appearance. He is not diaphoretic.  HENT:     Head: Normocephalic.  Eyes:     Conjunctiva/sclera: Conjunctivae normal.  Pulmonary:     Effort: Pulmonary effort is normal. No respiratory distress.  Neurological:     Mental Status: He is alert and oriented to person, place, and time. Mental status is at baseline.       Assessment & Plan    Primary hypertension -     hydroCHLOROthiazide ; Take 1 capsule (12.5 mg total) by mouth daily.  Dispense: 90 capsule; Refill: 3  Acute midline low back pain with bilateral sciatica -     Naproxen ; Take 1 tablet (500 mg total) by mouth 2 (two) times daily with a meal for 7 days.  Dispense: 14 tablet; Refill: 0 -     Cyclobenzaprine  HCl; Take 1 tablet (5 mg total) by mouth at bedtime as needed for muscle spasms.  Dispense: 20 tablet; Refill: 0      Hypertension Chronic, stable.  Blood pressure controlled with losartan  and hydrochlorothiazide . Home readings in target range. No symptoms reported. - Continue losartan  and hydrochlorothiazide  12.5 mg. - Send a 90-day supply of hydrochlorothiazide .  Chronic low back pain with bilateral sciatica and chronic right knee pain Persistent back and knee pain with sciatica. Gabapentin  underutilized. NSAID use supported by kidney function. - Prescribe naproxen  500 mg twice daily for one week. - Advise against using ibuprofen, BC powder, or home naproxen  while on prescribed naproxen . - Prescribe cyclobenzaprine  for nighttime use as needed. - Encourage continuation of gentle exercises.    Return in about 6 months (around 05/03/2024) for Chronic f/u, HTN.     I discussed the assessment and treatment plan with the patient. The patient was provided an opportunity to ask questions and all were answered. The patient agreed with the plan and demonstrated an understanding of the instructions.   The  patient was advised to call back or seek an in-person evaluation if the symptoms worsen or if the condition fails to improve as anticipated.  I provided 8 minutes of virtual-face-to-face time during this encounter.   LAURAINE LOISE BUOY, DO [facility] [phone] (phone) [phone] (fax)  [facility]

## 2023-11-23 ENCOUNTER — Other Ambulatory Visit: Payer: Self-pay | Admitting: Physician Assistant

## 2023-11-23 ENCOUNTER — Other Ambulatory Visit: Payer: Self-pay | Admitting: Family Medicine

## 2023-11-23 DIAGNOSIS — E781 Pure hyperglyceridemia: Secondary | ICD-10-CM

## 2023-11-23 DIAGNOSIS — F332 Major depressive disorder, recurrent severe without psychotic features: Secondary | ICD-10-CM

## 2023-11-23 DIAGNOSIS — F4001 Agoraphobia with panic disorder: Secondary | ICD-10-CM

## 2023-12-08 ENCOUNTER — Ambulatory Visit: Admitting: Family Medicine

## 2023-12-08 ENCOUNTER — Encounter: Payer: Self-pay | Admitting: Family Medicine

## 2023-12-08 VITALS — BP 133/81 | HR 101 | Ht 74.0 in | Wt 273.7 lb

## 2023-12-08 DIAGNOSIS — I10 Essential (primary) hypertension: Secondary | ICD-10-CM | POA: Diagnosis not present

## 2023-12-08 DIAGNOSIS — G479 Sleep disorder, unspecified: Secondary | ICD-10-CM | POA: Diagnosis not present

## 2023-12-08 DIAGNOSIS — F332 Major depressive disorder, recurrent severe without psychotic features: Secondary | ICD-10-CM

## 2023-12-08 DIAGNOSIS — E781 Pure hyperglyceridemia: Secondary | ICD-10-CM

## 2023-12-08 DIAGNOSIS — E7800 Pure hypercholesterolemia, unspecified: Secondary | ICD-10-CM

## 2023-12-08 MED ORDER — ESCITALOPRAM OXALATE 20 MG PO TABS: 20.0000 mg | ORAL_TABLET | Freq: Every day | ORAL | 2 refills | Status: AC

## 2023-12-08 MED ORDER — QUETIAPINE FUMARATE 50 MG PO TABS: 50.0000 mg | ORAL_TABLET | Freq: Every day | ORAL | 0 refills | Status: DC

## 2023-12-08 MED ORDER — ROSUVASTATIN CALCIUM 10 MG PO TABS: 10.0000 mg | ORAL_TABLET | Freq: Every day | ORAL | 2 refills | Status: DC

## 2023-12-08 MED ORDER — TRAZODONE HCL 100 MG PO TABS: 100.0000 mg | ORAL_TABLET | Freq: Every day | ORAL | 1 refills | Status: AC

## 2023-12-08 NOTE — Progress Notes (Signed)
 Established patient visit   Patient: Shaun Griffith   DOB: Feb 08, 1961   63 y.o. Male  MRN: [id number] Visit Date: 12/08/2023  Today's healthcare provider: LAURAINE LOISE BUOY, DO   Chief Complaint  Patient presents with   Insomnia    Not Sleeping at night. Dozing all day. Brain fog all the time. No energy. Some chest congestion.   Subjective    HPI Shaun Griffith is a 63 year old male with sleep apnea who presents with worsening insomnia and daytime fatigue.  He has experienced worsening insomnia over the past month, characterized by being awake all night and dozing off during the day, particularly after breakfast while watching TV. He describes his sleep pattern as feeling like his 'clock has flipped over' and is unable to return to a normal sleep schedule.  He is currently taking trazodone , which was increased in May, and Lexapro , which he feels is effective for his mood. He uses Ativan  sparingly, mainly when going to stores, as it causes grogginess. He has not tried melatonin.  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 He does not consume caffeine or alcohol at night and reports not exercising enough.  He has a history of sleep apnea but does not use a CPAP machine effectively due to poor sleep.   He inquires about blood work, mentioning it was not done during his last physical. He is interested in checking his insulin levels, although his last A1c was 5.4, which is normal.      Medications: Outpatient Medications Prior to Visit  Medication Sig   Azelastine -Fluticasone  137-50 MCG/ACT SUSP Place 1 spray into the nose every 12 (twelve) hours.   Bacillus Coagulans-Inulin (ALIGN PREBIOTIC-PROBIOTIC PO) Take 1 capsule by mouth daily.   budesonide-formoterol (SYMBICORT) 80-4.5 MCG/ACT inhaler Inhale 2 puffs into the lungs 2 (two) times daily.   Cholecalciferol (VITAMIN D3) 50 MCG (2000  UT) capsule Take 2,000 Units by mouth daily.   Docusate Calcium  (STOOL SOFTENER PO) Take by mouth as needed.   fexofenadine  (ALLEGRA ) 180 MG tablet Take 1 tablet (180 mg total) by mouth daily.   gabapentin  (NEURONTIN ) 800 MG tablet Take 1 tablet (800 mg total) by mouth 3 (three) times daily.   hydrochlorothiazide  (MICROZIDE ) 12.5 MG capsule Take 1 capsule (12.5 mg total) by mouth daily.   LORazepam  (ATIVAN ) 0.5 MG tablet TAKE ONE TABLET DAILY AS NEEDED   losartan  (COZAAR ) 100 MG tablet Take 1 tablet (100 mg total) by mouth daily.   Multiple Vitamin (MULTIVITAMIN) capsule Take 1 capsule by mouth daily.   omeprazole  (PRILOSEC) 40 MG capsule TAKE 1 CAPSULE BY MOUTH IN THE MORNING AND AT BEDTIME   [DISCONTINUED] escitalopram  (LEXAPRO ) 20 MG tablet TAKE 1 TABLET BY MOUTH ONCE DAILY   [DISCONTINUED] rosuvastatin  (CRESTOR ) 10 MG tablet TAKE ONE TABLET BY MOUTH ONCE DAILY   [DISCONTINUED] traZODone  (DESYREL ) 100 MG tablet Take 1 tablet (100 mg total) by mouth at bedtime.   [DISCONTINUED] cyclobenzaprine  (FLEXERIL ) 5 MG tablet Take 1 tablet (5 mg total) by mouth at bedtime as needed for muscle spasms.   No facility-administered medications prior to visit.        Objective    BP 133/81 (BP Location: Left Arm, Patient Position: Sitting, Cuff Size: Large)   Pulse (!) 101   Ht 6' 2 (1.88 m)   Wt 273 lb 11.2 oz (124.1 kg)  SpO2 97%   BMI 35.14 kg/m     Physical Exam Vitals and nursing note reviewed.  Constitutional:      General: He is not in acute distress.    Appearance: Normal appearance.  HENT:     Head: Normocephalic and atraumatic.  Eyes:     General: No scleral icterus.    Conjunctiva/sclera: Conjunctivae normal.  Cardiovascular:     Rate and Rhythm: Normal rate.  Pulmonary:     Effort: Pulmonary effort is normal.  Neurological:     Mental Status: He is alert and oriented to person, place, and time. Mental status is at baseline.  Psychiatric:        Mood and Affect: Mood  normal.        Behavior: Behavior normal.      No results found for any visits on 12/08/23.  Assessment & Plan    Sleep disturbance -     QUEtiapine Fumarate; Take 1 tablet (50 mg total) by mouth at bedtime.  Dispense: 40 tablet; Refill: 0 -     traZODone  HCl; Take 1 tablet (100 mg total) by mouth at bedtime.  Dispense: 90 tablet; Refill: 1  Moderately severe recurrent major depression (HCC) -     Escitalopram  Oxalate; Take 1 tablet (20 mg total) by mouth daily.  Dispense: 90 tablet; Refill: 2  High blood triglycerides -     Rosuvastatin  Calcium ; Take 1 tablet (10 mg total) by mouth daily.  Dispense: 90 tablet; Refill: 2 -     Hemoglobin A1c -     Lipid panel -     Insulin, Free and Total  Primary hypertension -     Comprehensive metabolic panel with GFR  Elevated LDL cholesterol level -     Hemoglobin A1c -     Lipid panel -     Insulin, Free and Total  Morbid obesity (HCC) -     Hemoglobin A1c -     Insulin, Free and Total      Sleep disturbance Chronic sleep disturbance with worsening symptoms, characterized by nocturnal wakefulness and daytime sleepiness. Previous trazodone  dosage increase in May to 100 mg nightly. - Continue trazodone  as prescribed.  Refill provided. - Prescribe Seroquel for short-term use to reset sleep cycle. - Recommend melatonin 1 to 10 mg nightly 30 to 60 minutes before sleep - Implement sleep hygiene: exercise during the day (at least 2 hours before sleep), avoid screens before bed, read before sleep.  Consider herbal teas such as chamomile.  Obstructive sleep apnea Diagnosed obstructive sleep apnea with ineffective CPAP use due to sleep disturbances. - Encourage CPAP use during the day, especially while watching TV.  Major depressive disorder Major depressive disorder managed with Lexapro . Reports feeling less depressed but experiencing sleep disturbances. Lexapro  working well. - Continue Lexapro  as prescribed.  General Health  Maintenance Discussed need for blood work not completed during last physical exam. Last year's A1c was 5.4, indicating normal glucose levels. - Order routine blood work.    Return in about 3 months (around 03/09/2024).      I discussed the assessment and treatment plan with the patient  The patient was provided an opportunity to ask questions and all were answered. The patient agreed with the plan and demonstrated an understanding of the instructions.   The patient was advised to call back or seek an in-person evaluation if the symptoms worsen or if the condition fails to improve as anticipated.    Jered Heiny N Avalie Oconnor, DO  [facility] [phone] (phone) [phone] (fax)  [facility]

## 2023-12-09 IMAGING — CT CT CHEST-ABD-PELV W/ CM
2 of 5 series · 13 of 36 positions shown, 15 images · IV contrast (agent unspecified)
Comparison: None.

CLINICAL DATA: Chronic abdominal pain.

EXAM:
CT CHEST, ABDOMEN, AND PELVIS WITH CONTRAST
TECHNIQUE: Multidetector CT imaging of the chest, abdomen and pelvis was
performed following the standard protocol during bolus
administration of intravenous contrast.

[Series 2: cap with · axial · 0.93mm/px · z∈[-589,-4]mm · 10 of 143 slices shown, 12 images]
[im 13/143  mediastinal]
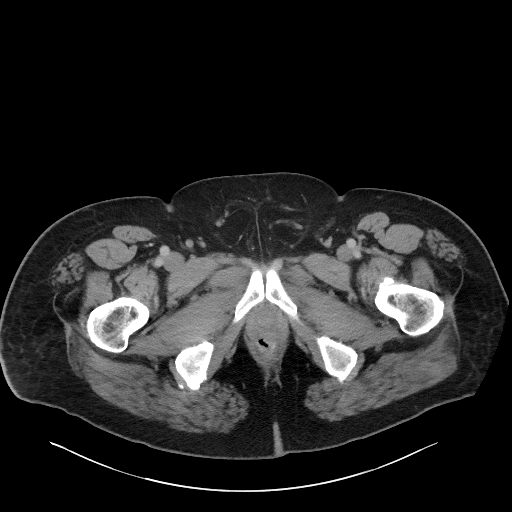
[im 13/143  bone]
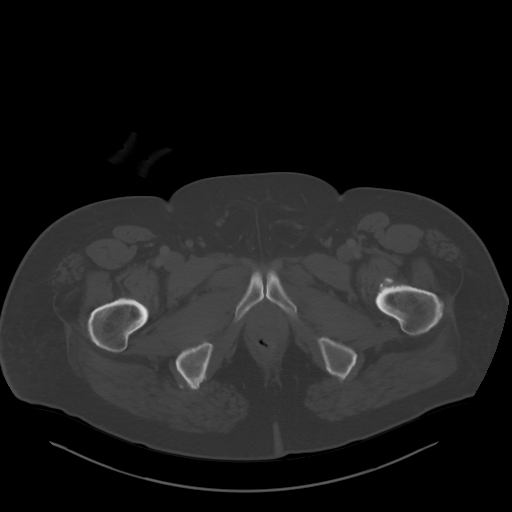
[im 26/143  mediastinal]
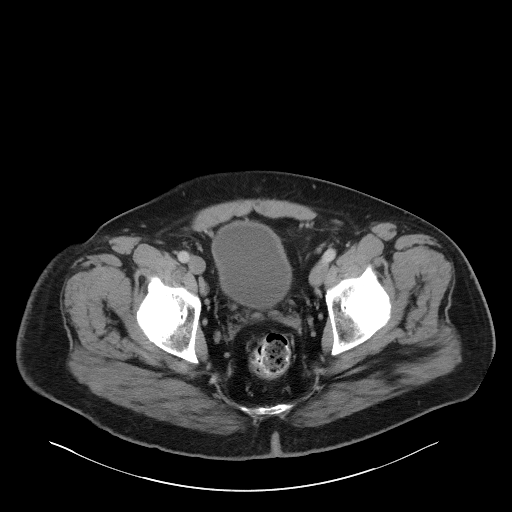
[im 39/143  mediastinal]
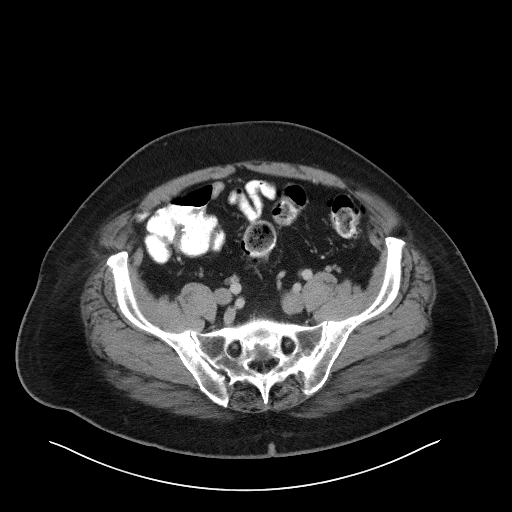
[im 52/143  mediastinal]
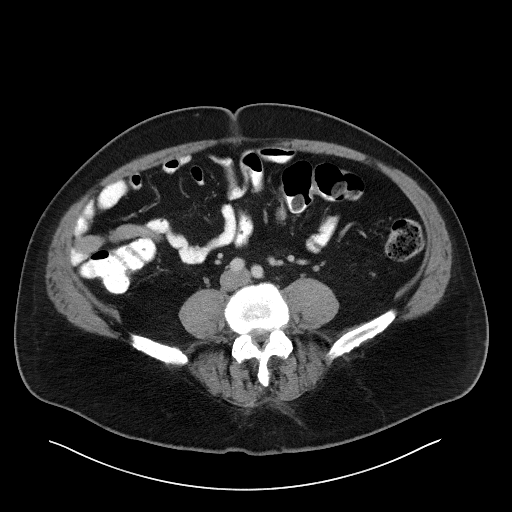
[im 65/143  mediastinal]
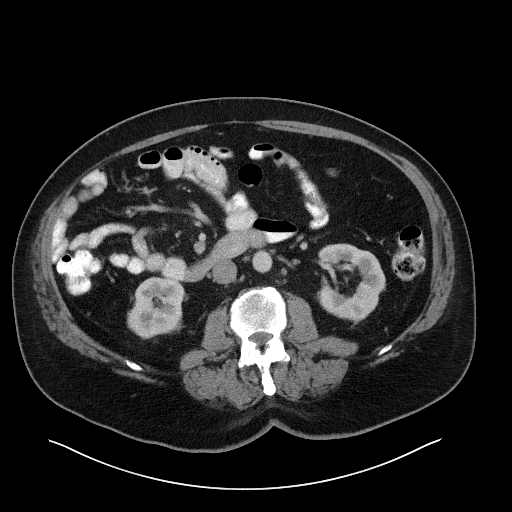
[im 78/143  mediastinal]
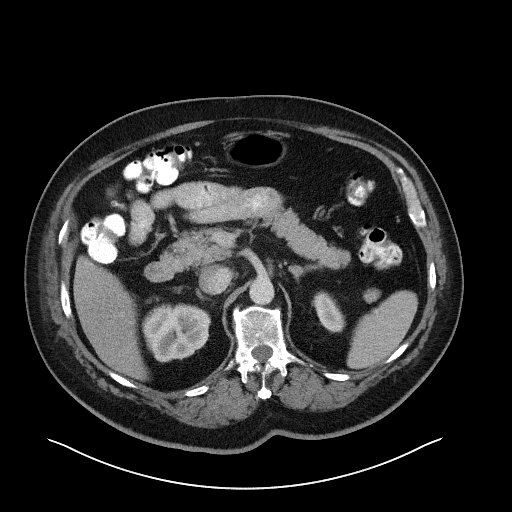
[im 91/143  mediastinal]
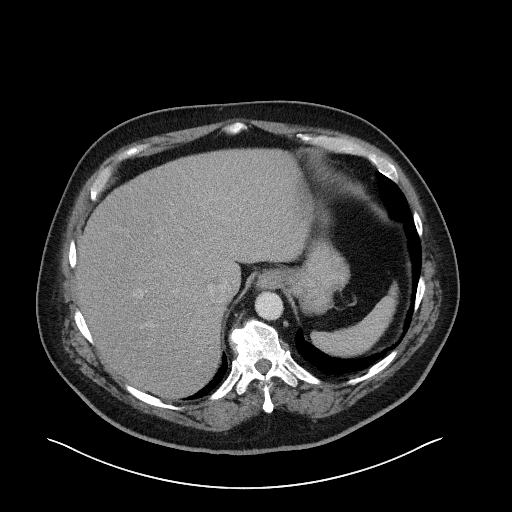
[im 104/143  mediastinal]
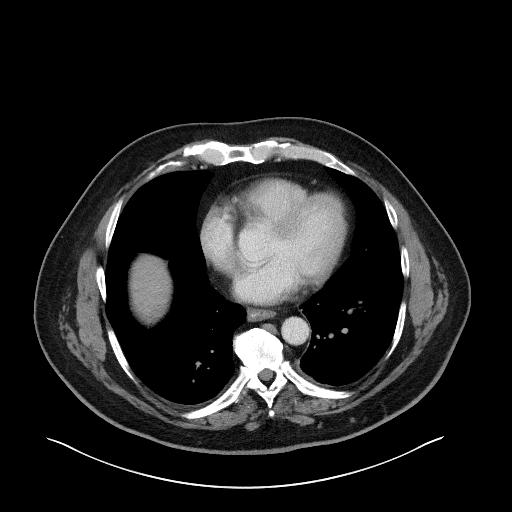
[im 117/143  mediastinal]
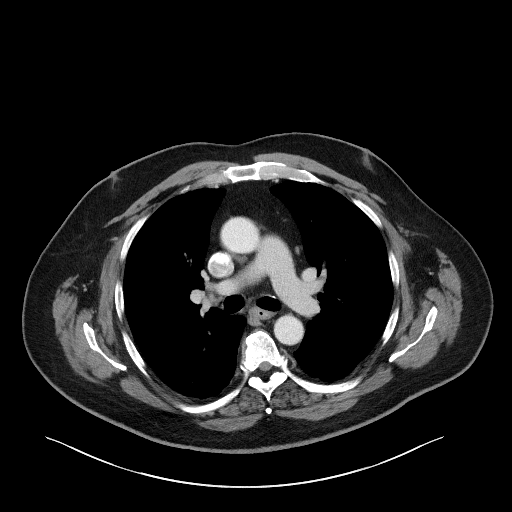
[im 117/143  bone]
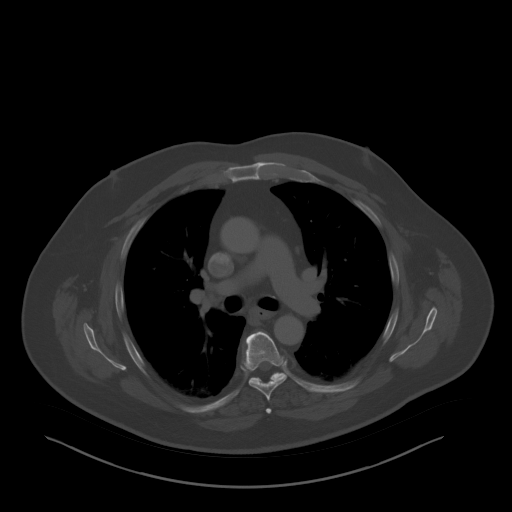
[im 130/143  mediastinal]
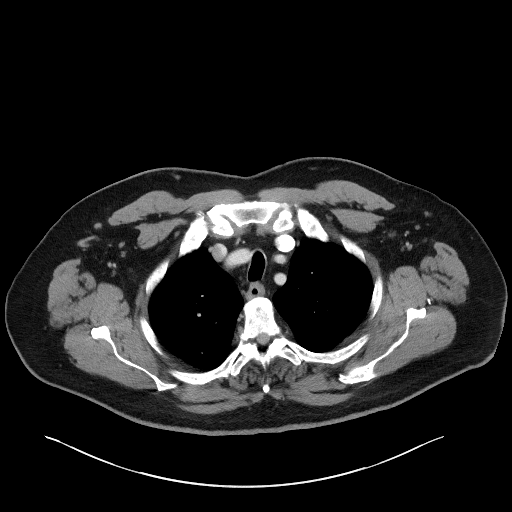

[Series 5: coronals · coronal · 0.96mm/px · 3 of 175 slices shown]
[im 35/175  mediastinal]
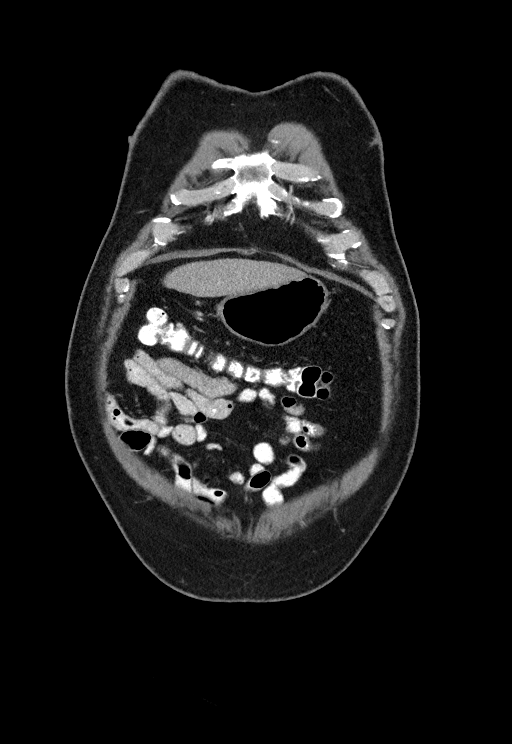
[im 70/175  mediastinal]
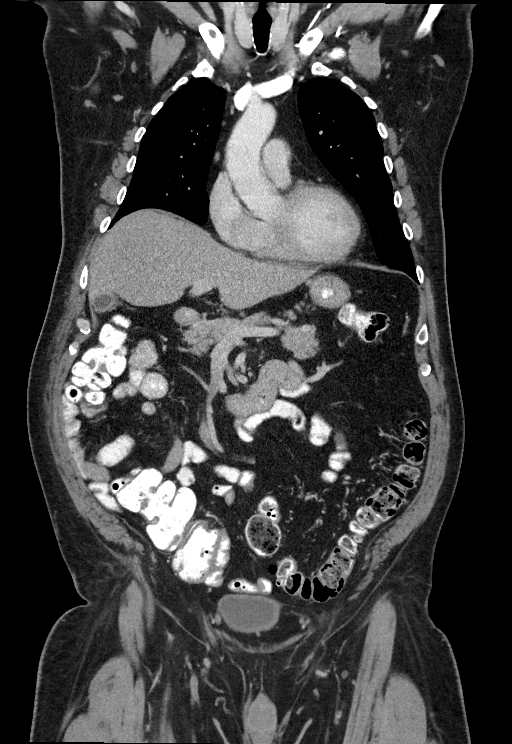
[im 105/175  mediastinal]
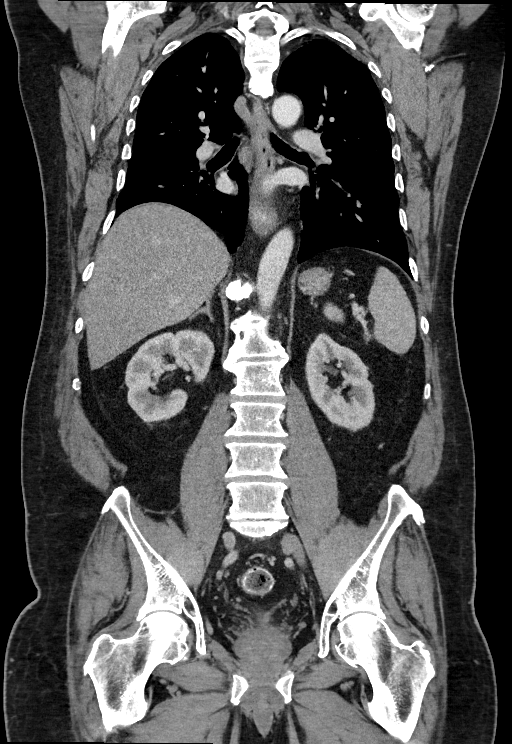

[13 of 36 positions shown; findings below may reference images not displayed]

RADIATION DOSE REDUCTION: This exam was performed according to the
departmental dose-optimization program which includes automated
exposure control, adjustment of the mA and/or kV according to
patient size and/or use of iterative reconstruction technique.

CONTRAST:  100mL OMNIPAQUE IOHEXOL 300 MG/ML  SOLN
FINDINGS: CT CHEST FINDINGS

Cardiovascular: Normal caliber thoracic aorta. No central pulmonary
embolus on this nondedicated study. Normal size heart. No
significant pericardial effusion/thickening.

Mediastinum/Nodes: No discrete thyroid nodule. No pathologically
enlarged mediastinal, hilar or axillary lymph nodes. Symmetric wall
thickening of the esophagus with retained/refluxed contrast in the
esophagus. Central airways are patent.

Lungs/Pleura: Hypoventilatory change in the dependent lungs. No
suspicious pulmonary nodules or masses. No pleural effusion. No
pneumothorax.

Musculoskeletal: Multilevel degenerative changes spine. No
aggressive lytic or blastic lesion of bone.

CT ABDOMEN PELVIS FINDINGS

Hepatobiliary: No suspicious hepatic lesion. Gallbladder is
unremarkable. No biliary ductal dilation.

Pancreas: No pancreatic ductal dilation or evidence of acute
inflammation.

Spleen: Within normal limits.

Adrenals/Urinary Tract: Bilateral adrenal glands appear normal. No
hydronephrosis. Symmetric enhancement and excretion of contrast from
the bilateral kidneys. No solid enhancing renal mass. Urinary
bladder is unremarkable for degree of distension.

Stomach/Bowel: Radiopaque enteric contrast material traverses the
rectum. Stomach is minimally distended. No pathologic dilation of
large or small bowel. The appendix and terminal ileum appear normal.
Moderate volume of formed stool throughout the colon. Colonic
diverticulosis without findings of acute diverticulitis. No
suspicious colonic wall thickening or mass like lesions identified.

Vascular/Lymphatic: No abdominal aortic aneurysm. No pathologically
enlarged abdominal or pelvic lymph nodes.

Reproductive: Prostate is unremarkable.

Other: No significant abdominopelvic free fluid.

Musculoskeletal: Multilevel degenerative changes spine. Mild
avascular necrosis of the left femoral head without collapse.
Degenerative change of the bilateral hips.
IMPRESSION: 1. Symmetric wall thickening of the esophagus with retained/refluxed
contrast in the esophagus, suggestive of esophagitis however
underlying mass like lesion not excluded consider further evaluation
with endoscopy.
2. Colonic diverticulosis without findings of acute diverticulitis.
3. Moderate volume of formed stool throughout the colon.
4. Mild avascular necrosis of the left femoral head without
collapse.

## 2023-12-14 ENCOUNTER — Ambulatory Visit: Payer: Self-pay | Admitting: Family Medicine

## 2023-12-17 LAB — COMPREHENSIVE METABOLIC PANEL WITH GFR
ALT: 18 IU/L (ref 0–44)
AST: 21 IU/L (ref 0–40)
Albumin: 4.4 g/dL (ref 3.9–4.9)
Alkaline Phosphatase: 60 IU/L (ref 47–123)
BUN/Creatinine Ratio: 21 (ref 10–24)
BUN: 18 mg/dL (ref 8–27)
Bilirubin Total: 0.5 mg/dL (ref 0.0–1.2)
CO2: 21 mmol/L (ref 20–29)
Calcium: 9.5 mg/dL (ref 8.6–10.2)
Chloride: 103 mmol/L (ref 96–106)
Creatinine, Ser: 0.87 mg/dL (ref 0.76–1.27)
Globulin, Total: 2.6 g/dL (ref 1.5–4.5)
Glucose: 89 mg/dL (ref 70–99)
Potassium: 3.8 mmol/L (ref 3.5–5.2)
Sodium: 141 mmol/L (ref 134–144)
Total Protein: 7 g/dL (ref 6.0–8.5)
eGFR: 96 mL/min/1.73 (ref >59)

## 2023-12-17 LAB — LIPID PANEL
Chol/HDL Ratio: 2.4 ratio (ref 0.0–5.0)
Cholesterol, Total: 140 mg/dL (ref 100–199)
HDL: 59 mg/dL (ref >39)
LDL Chol Calc (NIH): 62 mg/dL (ref 0–99)
Triglycerides: 105 mg/dL (ref 0–149)
VLDL Cholesterol Cal: 19 mg/dL (ref 5–40)

## 2023-12-17 LAB — HEMOGLOBIN A1C
Est. average glucose Bld gHb Est-mCnc: 108 mg/dL
Hgb A1c MFr Bld: 5.4 % (ref 4.8–5.6)

## 2023-12-17 LAB — INSULIN, FREE AND TOTAL
Free Insulin: 10 uU/mL
Total Insulin: 10 uU/mL

## 2024-01-13 ENCOUNTER — Other Ambulatory Visit: Payer: Self-pay | Admitting: Family Medicine

## 2024-01-13 DIAGNOSIS — I10 Essential (primary) hypertension: Secondary | ICD-10-CM

## 2024-01-14 ENCOUNTER — Encounter: Payer: Self-pay | Admitting: Family Medicine

## 2024-01-14 DIAGNOSIS — G479 Sleep disorder, unspecified: Secondary | ICD-10-CM

## 2024-01-14 MED ORDER — QUETIAPINE FUMARATE 50 MG PO TABS: 50.0000 mg | ORAL_TABLET | Freq: Every day | ORAL | 1 refills | Status: AC

## 2024-01-19 ENCOUNTER — Other Ambulatory Visit: Payer: Self-pay | Admitting: Family Medicine

## 2024-01-19 DIAGNOSIS — F4001 Agoraphobia with panic disorder: Secondary | ICD-10-CM

## 2024-01-19 NOTE — Telephone Encounter (Signed)
 LOV- 12/08/2023 NOV- 03/14/2024 LRF- 11/24/2023 Outpatient Medication Detail   Disp Refills Start End   LORazepam  (ATIVAN ) 0.5 MG tablet 30 tablet 1 11/24/2023 --   Sig: TAKE ONE TABLET DAILY AS NEEDED   Sent to pharmacy as: LORazepam  (ATIVAN ) 0.5 MG tablet   Notes to Pharmacy: PT READY FOR NEW RX.   E-Prescribing Status: Receipt confirmed by pharmacy (11/24/2023  1:03 PM EDT

## 2024-03-14 ENCOUNTER — Encounter: Payer: Self-pay | Admitting: Family Medicine

## 2024-03-14 ENCOUNTER — Ambulatory Visit (INDEPENDENT_AMBULATORY_CARE_PROVIDER_SITE_OTHER): Admitting: Family Medicine

## 2024-03-14 VITALS — BP 117/65 | HR 89 | Temp 98.9°F | Ht 74.0 in | Wt 279.3 lb

## 2024-03-14 DIAGNOSIS — E7800 Pure hypercholesterolemia, unspecified: Secondary | ICD-10-CM

## 2024-03-14 DIAGNOSIS — K581 Irritable bowel syndrome with constipation: Secondary | ICD-10-CM

## 2024-03-14 DIAGNOSIS — L02215 Cutaneous abscess of perineum: Secondary | ICD-10-CM

## 2024-03-14 MED ORDER — ROSUVASTATIN CALCIUM 5 MG PO TABS: 5.0000 mg | ORAL_TABLET | Freq: Every evening | ORAL | 3 refills | Status: AC

## 2024-03-14 MED ORDER — DOXYCYCLINE HYCLATE 100 MG PO TABS: 100.0000 mg | ORAL_TABLET | Freq: Two times a day (BID) | ORAL | 0 refills | Status: AC

## 2024-03-14 NOTE — Progress Notes (Signed)
 "     Established patient visit   Patient: Shaun Griffith   DOB: 24-Sep-1960   64 y.o. Male  MRN: [id number] Visit Date: 03/14/2024  Today's healthcare provider: LAURAINE LOISE BUOY, DO   Chief Complaint  Patient presents with   Medical Management of Chronic Issues    Patient is here today for a 3 month follow up, concerns that he has an ingrown hair on his scrotum.  For his IBS plus C he wants to discuss referral.  Shingles vaccine- will get at local drug store   Subjective    HPI Shaun Griffith is a 64 year old male who presents with a persistent skin lesion and requests a referral for irritable bowel syndrome.  He has had a skin lesion for approximately six months. It is a small, itchy area that emits an odor and some discharge if not kept clean. The lesion becomes red and sometimes resembles acne, with a small open spot from scratching. He recalls a similar issue about thirty years ago, which was treated with antibiotics and iodine cleaning.  He is seeking a referral for irritable bowel syndrome (IBS). He mentions previous medication trials, including a lubiprostone  which caused severe diarrhea. He has not tried Linzess  due to its high cost in the past but is interested in doing send. He currently uses Colace as needed for stool softening.  He has a history of high cholesterol and was previously on rosuvastatin  10 mg, which he stopped two weeks ago due to fatigue. He reports feeling better since discontinuing the medication. He has been on a sleeping medication (quetiapine , added to trazodone ) that has improved his sleep quality, although he still experiences some fatigue.  He is currently taking escitalopram  for mood and uses Ativan  as needed, approximately once a day, particularly in situations that cause anxiety, such as being in crowds or stores with high ceilings. Ativan  may contribute to his fatigue.     Medications: Show/hide medication list[1]       Objective     BP 117/65 (BP Location: Right Arm, Patient Position: Sitting, Cuff Size: Large)   Pulse 89   Temp 98.9 F (37.2 C) (Oral)   Ht 6' 2 (1.88 m)   Wt 279 lb 4.8 oz (126.7 kg)   SpO2 98%   BMI 35.86 kg/m     Physical Exam Vitals and nursing note reviewed.  Constitutional:      General: He is not in acute distress.    Appearance: Normal appearance. He is not diaphoretic.  HENT:     Head: Normocephalic and atraumatic.  Eyes:     General: No scleral icterus.    Conjunctiva/sclera: Conjunctivae normal.  Cardiovascular:     Rate and Rhythm: Normal rate and regular rhythm.     Pulses: Normal pulses.     Heart sounds: Normal heart sounds. No murmur heard. Pulmonary:     Effort: Pulmonary effort is normal. No respiratory distress.     Breath sounds: Normal breath sounds. No wheezing or rhonchi.  Genitourinary:   Musculoskeletal:     Cervical back: Neck supple.     Right lower leg: No edema.     Left lower leg: No edema.  Lymphadenopathy:     Cervical: No cervical adenopathy.  Skin:    General: Skin is warm and dry.     Findings: No rash.  Neurological:     Mental Status: He is alert and oriented to person, place, and time. Mental status is  at baseline.  Psychiatric:        Mood and Affect: Mood normal.        Behavior: Behavior normal.      No results found for any visits on 03/14/24.  Assessment & Plan    Irritable bowel syndrome with constipation -     Ambulatory referral to Gastroenterology  Elevated LDL cholesterol level -     Rosuvastatin  Calcium ; Take 1 tablet (5 mg total) by mouth at bedtime.  Dispense: 90 tablet; Refill: 3  Cutaneous abscess of perineum -     Doxycycline  Hyclate; Take 1 tablet (100 mg total) by mouth 2 (two) times daily for 7 days.  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 Severe diarrhea with Lubiprostone . Linzess  not tried due to cost. Using Colace as needed. - Sent  referral for local specialist. - Discussed potential use of Linzess  if cost becomes manageable.  Elevated LDL cholesterol level Previously on rosuvastatin  10 mg, discontinued due to fatigue. Discussed risks of not taking statins versus benefits of taking them. Switch to nighttime dosing instead of morning.  Consider lower dose or alternative statin if fatigue persists.   - Prescribed rosuvastatin  5 mg before sleep. - Consider switching to pravastatin if fatigue persists.  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 Possibly formed from infection of intermittently-occurring acne-like lesions. Previous oral antibiotics and iodine cleaning. No topical antibiotics. Possible bacterial colonization. - Prescribed a week of oral antibiotics. - Advised warm compresses and keeping area clean and dry. - Recommended wearing looser fitting pants. - Consider full-body topical chlorhexidine gluconate wash if abscess recurs.  General Health Maintenance Due for shingles vaccine. - Patient plans to receive shingles vaccine at a drugstore.    Return in about 6 months (around 09/11/2024) for Chronic f/u w/Dr. Franchot.      I discussed the assessment and treatment plan with the patient  The patient was provided an opportunity to ask questions and all were answered. The patient agreed with the plan and demonstrated an understanding of the instructions.   The patient was advised to call back or seek an in-person evaluation if the symptoms worsen or if the condition fails to improve as anticipated.    LAURAINE LOISE BUOY, DO  [facility] [place] [phone] (phone) [phone] (fax)  [facility]     [1]  Outpatient Medications Prior to Visit  Medication Sig Note   Azelastine -Fluticasone  137-50 MCG/ACT SUSP Place 1 spray into the nose every 12 (twelve) hours.    Bacillus Coagulans-Inulin (ALIGN PREBIOTIC-PROBIOTIC PO) Take  1 capsule by mouth daily.    budesonide-formoterol (SYMBICORT) 80-4.5 MCG/ACT inhaler Inhale 2 puffs into the lungs 2 (two) times daily.    Cholecalciferol (VITAMIN D3) 50 MCG (2000 UT) capsule Take 2,000 Units by mouth daily.    Docusate Calcium  (STOOL SOFTENER PO) Take by mouth as needed.    escitalopram  (LEXAPRO ) 20 MG tablet Take 1 tablet (20 mg total) by mouth daily.    fexofenadine  (ALLEGRA ) 180 MG tablet Take 1 tablet (180 mg total) by mouth daily.    gabapentin  (NEURONTIN ) 800 MG tablet Take 1 tablet (800 mg total) by mouth 3 (three) times daily.    hydrochlorothiazide  (MICROZIDE ) 12.5 MG capsule Take 1 capsule (12.5 mg total) by mouth daily.    LORazepam  (ATIVAN ) 0.5 MG tablet TAKE 1 TABLET BY MOUTH ONCE DAILY AS NEEDED  losartan  (COZAAR ) 100 MG tablet Take 1 tablet (100 mg total) by mouth daily.    Multiple Vitamin (MULTIVITAMIN) capsule Take 1 capsule by mouth daily.    omeprazole  (PRILOSEC) 40 MG capsule TAKE 1 CAPSULE BY MOUTH IN THE MORNING AND AT BEDTIME    QUEtiapine  (SEROQUEL ) 50 MG tablet Take 1 tablet (50 mg total) by mouth at bedtime.    traZODone  (DESYREL ) 100 MG tablet Take 1 tablet (100 mg total) by mouth at bedtime.    [DISCONTINUED] rosuvastatin  (CRESTOR ) 10 MG tablet Take 1 tablet (10 mg total) by mouth daily. 03/14/2024: significant fatigue   No facility-administered medications prior to visit.   "

## 2024-03-14 NOTE — Patient Instructions (Addendum)
 Get Shingrix at pharmacy.  Use warm compresses for irritated area and keep it clean and dry.

## 2024-04-06 ENCOUNTER — Encounter: Payer: Self-pay | Admitting: Family Medicine

## 2024-04-14 ENCOUNTER — Ambulatory Visit: Payer: Self-pay
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
## 2024-04-17 ENCOUNTER — Ambulatory Visit: Admitting: Family Medicine

## 2024-05-11 ENCOUNTER — Ambulatory Visit: Admitting: Gastroenterology

## 2024-09-13 ENCOUNTER — Encounter
